# Patient Record
Sex: Female | Born: 1965 | Race: Black or African American | Hispanic: No | Marital: Single | Smoking: Current every day smoker
Health system: Southern US, Community
[De-identification: ages and names within clinical notes are randomized; demographics above are authoritative.]

## PROBLEM LIST (undated history)

## (undated) DIAGNOSIS — J302 Other seasonal allergic rhinitis: Secondary | ICD-10-CM

## (undated) DIAGNOSIS — J189 Pneumonia, unspecified organism: Secondary | ICD-10-CM

## (undated) DIAGNOSIS — Z973 Presence of spectacles and contact lenses: Secondary | ICD-10-CM

## (undated) DIAGNOSIS — F32A Depression, unspecified: Secondary | ICD-10-CM

## (undated) DIAGNOSIS — K219 Gastro-esophageal reflux disease without esophagitis: Secondary | ICD-10-CM

## (undated) DIAGNOSIS — F329 Major depressive disorder, single episode, unspecified: Secondary | ICD-10-CM

## (undated) DIAGNOSIS — S83209A Unspecified tear of unspecified meniscus, current injury, unspecified knee, initial encounter: Secondary | ICD-10-CM

## (undated) HISTORY — PX: NO PAST SURGERIES: SHX2092

## (undated) HISTORY — PX: ANKLE SURGERY: SHX546

---

## 1997-04-11 ENCOUNTER — Emergency Department (HOSPITAL_COMMUNITY): Admission: EM | Admit: 1997-04-11 | Discharge: 1997-04-11 | Payer: Self-pay | Admitting: Emergency Medicine

## 2003-03-18 ENCOUNTER — Inpatient Hospital Stay (HOSPITAL_COMMUNITY): Admission: EM | Admit: 2003-03-18 | Discharge: 2003-03-24 | Payer: Self-pay | Admitting: Emergency Medicine

## 2004-11-30 ENCOUNTER — Emergency Department (HOSPITAL_COMMUNITY): Admission: EM | Admit: 2004-11-30 | Discharge: 2004-11-30 | Payer: Self-pay | Admitting: Emergency Medicine

## 2005-01-22 ENCOUNTER — Emergency Department (HOSPITAL_COMMUNITY): Admission: EM | Admit: 2005-01-22 | Discharge: 2005-01-22 | Payer: Self-pay | Admitting: Emergency Medicine

## 2005-05-08 ENCOUNTER — Emergency Department (HOSPITAL_COMMUNITY): Admission: EM | Admit: 2005-05-08 | Discharge: 2005-05-08 | Payer: Self-pay | Admitting: Emergency Medicine

## 2005-05-08 ENCOUNTER — Emergency Department (HOSPITAL_COMMUNITY): Admission: EM | Admit: 2005-05-08 | Discharge: 2005-05-09 | Payer: Self-pay | Admitting: Emergency Medicine

## 2005-06-08 ENCOUNTER — Emergency Department (HOSPITAL_COMMUNITY): Admission: EM | Admit: 2005-06-08 | Discharge: 2005-06-08 | Payer: Self-pay | Admitting: Emergency Medicine

## 2006-05-07 ENCOUNTER — Emergency Department (HOSPITAL_COMMUNITY): Admission: EM | Admit: 2006-05-07 | Discharge: 2006-05-07 | Payer: Self-pay | Admitting: Emergency Medicine

## 2006-07-01 ENCOUNTER — Emergency Department (HOSPITAL_COMMUNITY): Admission: EM | Admit: 2006-07-01 | Discharge: 2006-07-01 | Payer: Self-pay | Admitting: Emergency Medicine

## 2006-07-14 ENCOUNTER — Emergency Department (HOSPITAL_COMMUNITY): Admission: EM | Admit: 2006-07-14 | Discharge: 2006-07-14 | Payer: Self-pay | Admitting: Emergency Medicine

## 2008-04-20 ENCOUNTER — Emergency Department (HOSPITAL_COMMUNITY): Admission: EM | Admit: 2008-04-20 | Discharge: 2008-04-20 | Payer: Self-pay | Admitting: Emergency Medicine

## 2008-06-02 ENCOUNTER — Emergency Department (HOSPITAL_COMMUNITY): Admission: EM | Admit: 2008-06-02 | Discharge: 2008-06-02 | Payer: Self-pay | Admitting: Emergency Medicine

## 2008-06-26 ENCOUNTER — Emergency Department (HOSPITAL_COMMUNITY): Admission: EM | Admit: 2008-06-26 | Discharge: 2008-06-26 | Payer: Self-pay | Admitting: Emergency Medicine

## 2008-07-14 ENCOUNTER — Encounter: Payer: Self-pay | Admitting: Obstetrics & Gynecology

## 2008-07-14 ENCOUNTER — Inpatient Hospital Stay (HOSPITAL_COMMUNITY): Admission: AD | Admit: 2008-07-14 | Discharge: 2008-07-14 | Payer: Self-pay | Admitting: Obstetrics & Gynecology

## 2008-07-17 ENCOUNTER — Inpatient Hospital Stay (HOSPITAL_COMMUNITY): Admission: AD | Admit: 2008-07-17 | Discharge: 2008-07-17 | Payer: Self-pay | Admitting: Obstetrics & Gynecology

## 2008-07-24 ENCOUNTER — Ambulatory Visit (HOSPITAL_COMMUNITY): Admission: AD | Admit: 2008-07-24 | Discharge: 2008-07-24 | Payer: Self-pay | Admitting: Obstetrics & Gynecology

## 2008-10-25 ENCOUNTER — Inpatient Hospital Stay (HOSPITAL_COMMUNITY): Admission: AD | Admit: 2008-10-25 | Discharge: 2008-10-25 | Payer: Self-pay | Admitting: Obstetrics & Gynecology

## 2009-02-26 ENCOUNTER — Emergency Department (HOSPITAL_COMMUNITY): Admission: EM | Admit: 2009-02-26 | Discharge: 2009-02-26 | Payer: Self-pay | Admitting: Emergency Medicine

## 2010-04-12 LAB — URINE MICROSCOPIC-ADD ON

## 2010-04-12 LAB — URINALYSIS, ROUTINE W REFLEX MICROSCOPIC
Bilirubin Urine: NEGATIVE
Glucose, UA: NEGATIVE mg/dL
Ketones, ur: NEGATIVE mg/dL
Leukocytes, UA: NEGATIVE
Nitrite: NEGATIVE
Protein, ur: NEGATIVE mg/dL
Specific Gravity, Urine: >1.03 — ABNORMAL HIGH (ref 1.005–1.030)
Urobilinogen, UA: 0.2 mg/dL (ref 0.0–1.0)
pH: 5.5 (ref 5.0–8.0)

## 2010-04-12 LAB — CBC
HCT: 41.2 % (ref 36.0–46.0)
Hemoglobin: 13.7 g/dL (ref 12.0–15.0)
MCHC: 33.2 g/dL (ref 30.0–36.0)
MCV: 98.7 fL (ref 78.0–100.0)
Platelets: 219 10*3/uL (ref 150–400)
RBC: 4.18 MIL/uL (ref 3.87–5.11)
RDW: 12.9 % (ref 11.5–15.5)
WBC: 8.3 10*3/uL (ref 4.0–10.5)

## 2010-04-12 LAB — WET PREP, GENITAL
Trich, Wet Prep: NONE SEEN
Yeast Wet Prep HPF POC: NONE SEEN

## 2010-04-12 LAB — GC/CHLAMYDIA PROBE AMP, GENITAL
Chlamydia, DNA Probe: NEGATIVE
GC Probe Amp, Genital: NEGATIVE

## 2010-04-12 LAB — POCT PREGNANCY, URINE: Preg Test, Ur: NEGATIVE

## 2010-04-15 LAB — URINALYSIS, ROUTINE W REFLEX MICROSCOPIC
Bilirubin Urine: NEGATIVE
Glucose, UA: NEGATIVE mg/dL
Ketones, ur: NEGATIVE mg/dL
Leukocytes, UA: NEGATIVE
Nitrite: NEGATIVE
Protein, ur: NEGATIVE mg/dL
Specific Gravity, Urine: <1.005 — ABNORMAL LOW (ref 1.005–1.030)
Urobilinogen, UA: 0.2 mg/dL (ref 0.0–1.0)
pH: 5 (ref 5.0–8.0)

## 2010-04-15 LAB — RAPID URINE DRUG SCREEN, HOSP PERFORMED
Amphetamines: NOT DETECTED
Barbiturates: NOT DETECTED
Benzodiazepines: NOT DETECTED
Cocaine: NOT DETECTED
Opiates: NOT DETECTED
Tetrahydrocannabinol: NOT DETECTED

## 2010-04-15 LAB — GC/CHLAMYDIA PROBE AMP, GENITAL
Chlamydia, DNA Probe: NEGATIVE
GC Probe Amp, Genital: NEGATIVE

## 2010-04-15 LAB — CBC
HCT: 40.4 % (ref 36.0–46.0)
Hemoglobin: 13.7 g/dL (ref 12.0–15.0)
MCHC: 33.9 g/dL (ref 30.0–36.0)
MCV: 98.3 fL (ref 78.0–100.0)
Platelets: 250 10*3/uL (ref 150–400)
RBC: 4.11 MIL/uL (ref 3.87–5.11)
RDW: 13.1 % (ref 11.5–15.5)
WBC: 10.9 10*3/uL — ABNORMAL HIGH (ref 4.0–10.5)

## 2010-04-15 LAB — BASIC METABOLIC PANEL
BUN: 4 mg/dL — ABNORMAL LOW (ref 6–23)
CO2: 23 mEq/L (ref 19–32)
Calcium: 9.3 mg/dL (ref 8.4–10.5)
Chloride: 106 mEq/L (ref 96–112)
Creatinine, Ser: 0.68 mg/dL (ref 0.4–1.2)
GFR calc Af Amer: >60 mL/min (ref >60)
GFR calc non Af Amer: >60 mL/min (ref >60)
Glucose, Bld: 115 mg/dL — ABNORMAL HIGH (ref 70–99)
Potassium: 3.5 mEq/L (ref 3.5–5.1)
Sodium: 137 mEq/L (ref 135–145)

## 2010-04-15 LAB — URINE MICROSCOPIC-ADD ON: WBC, UA: NONE SEEN WBC/hpf (ref <3)

## 2010-04-15 LAB — DIFFERENTIAL
Basophils Absolute: 0 10*3/uL (ref 0.0–0.1)
Basophils Relative: 0 % (ref 0–1)
Eosinophils Absolute: 0.1 10*3/uL (ref 0.0–0.7)
Eosinophils Relative: 1 % (ref 0–5)
Lymphocytes Relative: 33 % (ref 12–46)
Lymphs Abs: 3.6 10*3/uL (ref 0.7–4.0)
Monocytes Absolute: 0.9 10*3/uL (ref 0.1–1.0)
Monocytes Relative: 8 % (ref 3–12)
Neutro Abs: 6.3 10*3/uL (ref 1.7–7.7)
Neutrophils Relative %: 58 % (ref 43–77)

## 2010-04-15 LAB — WET PREP, GENITAL
Clue Cells Wet Prep HPF POC: NONE SEEN
Trich, Wet Prep: NONE SEEN
Yeast Wet Prep HPF POC: NONE SEEN

## 2010-04-15 LAB — HCG, QUANTITATIVE, PREGNANCY
hCG, Beta Chain, Quant, S: 289 m[IU]/mL — ABNORMAL HIGH (ref <5)
hCG, Beta Chain, Quant, S: 67 m[IU]/mL — ABNORMAL HIGH (ref <5)
hCG, Beta Chain, Quant, S: 8 m[IU]/mL — ABNORMAL HIGH (ref <5)

## 2010-04-15 LAB — ABO/RH: ABO/RH(D): A POS

## 2010-04-15 LAB — POCT PREGNANCY, URINE: Preg Test, Ur: POSITIVE

## 2010-05-25 NOTE — Discharge Summary (Signed)
NAME:  Angel Mendez, Angel Mendez                     ACCOUNT NO.:  [id number]   MEDICAL RECORD NO.:  [id number]                   PATIENT TYPE:  INP   LOCATION:  5028                                 FACILITY:  [facility]   PHYSICIAN:  Lorelle Formosa, M.D.           DATE OF BIRTH:  08-06-1965   DATE OF ADMISSION:  03/18/2003  DATE OF DISCHARGE:  03/24/2003                                 DISCHARGE SUMMARY   ADMISSION DIAGNOSIS:  Pneumonia __________  .   DISCHARGE CONDITION:  Stable.   DISCHARGE MEDICATIONS:  1. Avelox 400 mg daily.  2. Tylenol p.r.n. for pain.   HISTORY:  The patient is a 45 year old white woman presented to the  emergency room with chest pain onset __________  family.  She became ill  approximately two days.  Had some diarrhea but no chills, no fever.   PHYSICAL EXAMINATION:  VITAL SIGNS:  Blood pressure 99/72, pulse 78,  respirations 19, temperature 97.8, O2 sat on room air was 95.  HEENT:  Within normal limits.  NECK:  Supple.  CHEST:  Clear to auscultation.  HEART:  Regular rhythm rate.  No murmur.  ABDOMEN:  Soft.  EXTREMITIES:  __________ .  NEUROLOGIC:  Grossly unremarkable.   LABS:  PH of 7.42, pCO2 of 47.  WBC 13.3 with a hemoglobin of 12.2,  hematocrit 35.8, platelets 284,000.  Repeat was white count of 8.3.  Chemistries revealed initial potassium 3.3, corrected to 4.  Cultures of  blood and urine.  Culture of blood was negative with urine culture  Streptococcus BP of greater than 100,000 colony count with high probability  for Streptococcus bovis.   Chest x-ray revealed a slight increase in the left lower lobe consolidation  and a stable to slightly larger pleural effusion with minimal streaking  right basilar atelectasis.  Repeat chest x-ray, March 23, 2003, two days  later, revealed improved left lower lobe infiltrate with probable small left  pleural effusion.   HOSPITAL COURSE:  The patient was admitted to the hospital.  Given Avelox  400 mg  daily and albuterol 2.5 mg q. 6h. to  q.4h.  She was monitored and  gradually improved. Thus, she was discharged to outpatient followup.                                                Lorelle Formosa, M.D.    WWM/MEDQ  D:  05/26/2003  T:  05/28/2003  Job:  [id number]

## 2010-08-24 ENCOUNTER — Inpatient Hospital Stay (HOSPITAL_COMMUNITY)
Admission: EM | Admit: 2010-08-24 | Discharge: 2010-08-25 | DRG: 639 | Disposition: A | Discharge status: Home / Self Care | Payer: Self-pay | Attending: Emergency Medicine | Admitting: Emergency Medicine

## 2010-08-24 DIAGNOSIS — I10 Essential (primary) hypertension: Secondary | ICD-10-CM | POA: Diagnosis present

## 2010-08-24 DIAGNOSIS — E119 Type 2 diabetes mellitus without complications: Principal | ICD-10-CM | POA: Diagnosis present

## 2010-08-24 DIAGNOSIS — F17200 Nicotine dependence, unspecified, uncomplicated: Secondary | ICD-10-CM | POA: Diagnosis present

## 2010-08-24 DIAGNOSIS — A5901 Trichomonal vulvovaginitis: Secondary | ICD-10-CM | POA: Diagnosis present

## 2010-08-24 DIAGNOSIS — F1020 Alcohol dependence, uncomplicated: Secondary | ICD-10-CM | POA: Diagnosis present

## 2010-08-24 DIAGNOSIS — J45909 Unspecified asthma, uncomplicated: Secondary | ICD-10-CM | POA: Diagnosis present

## 2010-08-24 LAB — GLUCOSE, CAPILLARY
Glucose-Capillary: 321 mg/dL — ABNORMAL HIGH (ref 70–99)
Glucose-Capillary: 340 mg/dL — ABNORMAL HIGH (ref 70–99)
Glucose-Capillary: 523 mg/dL — ABNORMAL HIGH (ref 70–99)

## 2010-08-24 LAB — URINALYSIS, ROUTINE W REFLEX MICROSCOPIC
Bilirubin Urine: NEGATIVE
Glucose, UA: >1000 mg/dL — AB
Ketones, ur: NEGATIVE mg/dL
Nitrite: NEGATIVE
Protein, ur: NEGATIVE mg/dL
Specific Gravity, Urine: 1.024 (ref 1.005–1.030)
Urobilinogen, UA: 0.2 mg/dL (ref 0.0–1.0)
pH: 5.5 (ref 5.0–8.0)

## 2010-08-24 LAB — COMPREHENSIVE METABOLIC PANEL
ALT: 44 U/L — ABNORMAL HIGH (ref 0–35)
AST: 40 U/L — ABNORMAL HIGH (ref 0–37)
Albumin: 3.4 g/dL — ABNORMAL LOW (ref 3.5–5.2)
Alkaline Phosphatase: 103 U/L (ref 39–117)
BUN: 4 mg/dL — ABNORMAL LOW (ref 6–23)
CO2: 24 mEq/L (ref 19–32)
Calcium: 9.7 mg/dL (ref 8.4–10.5)
Chloride: 95 mEq/L — ABNORMAL LOW (ref 96–112)
Creatinine, Ser: 0.77 mg/dL (ref 0.50–1.10)
GFR calc Af Amer: >60 mL/min (ref >60)
GFR calc non Af Amer: >60 mL/min (ref >60)
Glucose, Bld: 512 mg/dL — ABNORMAL HIGH (ref 70–99)
Potassium: 3.9 mEq/L (ref 3.5–5.1)
Sodium: 131 mEq/L — ABNORMAL LOW (ref 135–145)
Total Bilirubin: 0.3 mg/dL (ref 0.3–1.2)
Total Protein: 7.4 g/dL (ref 6.0–8.3)

## 2010-08-24 LAB — CBC
HCT: 41.9 % (ref 36.0–46.0)
Hemoglobin: 14.9 g/dL (ref 12.0–15.0)
MCH: 32 pg (ref 26.0–34.0)
MCHC: 35.6 g/dL (ref 30.0–36.0)
MCV: 89.9 fL (ref 78.0–100.0)
Platelets: 192 10*3/uL (ref 150–400)
RBC: 4.66 MIL/uL (ref 3.87–5.11)
RDW: 12.2 % (ref 11.5–15.5)
WBC: 11.3 10*3/uL — ABNORMAL HIGH (ref 4.0–10.5)

## 2010-08-24 LAB — URINE MICROSCOPIC-ADD ON

## 2010-08-24 LAB — POCT I-STAT, CHEM 8
BUN: <3 mg/dL — ABNORMAL LOW (ref 6–23)
Calcium, Ion: 1.18 mmol/L (ref 1.12–1.32)
Chloride: 97 mEq/L (ref 96–112)
Creatinine, Ser: 0.9 mg/dL (ref 0.50–1.10)
Glucose, Bld: 479 mg/dL — ABNORMAL HIGH (ref 70–99)
HCT: 48 % — ABNORMAL HIGH (ref 36.0–46.0)
Hemoglobin: 16.3 g/dL — ABNORMAL HIGH (ref 12.0–15.0)
Potassium: 4.3 mEq/L (ref 3.5–5.1)
Sodium: 132 mEq/L — ABNORMAL LOW (ref 135–145)
TCO2: 24 mmol/L (ref 0–100)

## 2010-08-24 LAB — DIFFERENTIAL
Basophils Absolute: 0 10*3/uL (ref 0.0–0.1)
Basophils Relative: 0 % (ref 0–1)
Eosinophils Absolute: 0.2 10*3/uL (ref 0.0–0.7)
Eosinophils Relative: 2 % (ref 0–5)
Lymphocytes Relative: 41 % (ref 12–46)
Lymphs Abs: 4.6 10*3/uL — ABNORMAL HIGH (ref 0.7–4.0)
Monocytes Absolute: 0.5 10*3/uL (ref 0.1–1.0)
Monocytes Relative: 5 % (ref 3–12)
Neutro Abs: 5.9 10*3/uL (ref 1.7–7.7)
Neutrophils Relative %: 52 % (ref 43–77)

## 2010-08-24 LAB — LIPASE, BLOOD: Lipase: 44 U/L (ref 11–59)

## 2010-08-25 LAB — BASIC METABOLIC PANEL
BUN: 4 mg/dL — ABNORMAL LOW (ref 6–23)
CO2: 22 mEq/L (ref 19–32)
Calcium: 8.8 mg/dL (ref 8.4–10.5)
Chloride: 99 mEq/L (ref 96–112)
Creatinine, Ser: 0.66 mg/dL (ref 0.50–1.10)
GFR calc Af Amer: >60 mL/min (ref >60)
GFR calc non Af Amer: >60 mL/min (ref >60)
Glucose, Bld: 400 mg/dL — ABNORMAL HIGH (ref 70–99)
Potassium: 3.5 mEq/L (ref 3.5–5.1)
Sodium: 131 mEq/L — ABNORMAL LOW (ref 135–145)

## 2010-08-25 LAB — GLUCOSE, CAPILLARY
Glucose-Capillary: 157 mg/dL — ABNORMAL HIGH (ref 70–99)
Glucose-Capillary: 174 mg/dL — ABNORMAL HIGH (ref 70–99)
Glucose-Capillary: 278 mg/dL — ABNORMAL HIGH (ref 70–99)

## 2010-10-23 LAB — POCT PREGNANCY, URINE
Operator id: 10119
Preg Test, Ur: NEGATIVE

## 2010-12-25 ENCOUNTER — Emergency Department (HOSPITAL_COMMUNITY): Payer: Medicaid Other

## 2010-12-25 ENCOUNTER — Emergency Department (HOSPITAL_COMMUNITY)
Admission: EM | Admit: 2010-12-25 | Discharge: 2010-12-25 | Disposition: A | Discharge status: Home / Self Care | Payer: Medicaid Other | Attending: Emergency Medicine | Admitting: Emergency Medicine

## 2010-12-25 ENCOUNTER — Encounter: Payer: Self-pay | Admitting: *Deleted

## 2010-12-25 DIAGNOSIS — S63509A Unspecified sprain of unspecified wrist, initial encounter: Secondary | ICD-10-CM | POA: Insufficient documentation

## 2010-12-25 DIAGNOSIS — W010XXA Fall on same level from slipping, tripping and stumbling without subsequent striking against object, initial encounter: Secondary | ICD-10-CM | POA: Insufficient documentation

## 2010-12-25 DIAGNOSIS — F17200 Nicotine dependence, unspecified, uncomplicated: Secondary | ICD-10-CM | POA: Insufficient documentation

## 2010-12-25 DIAGNOSIS — Z79899 Other long term (current) drug therapy: Secondary | ICD-10-CM | POA: Insufficient documentation

## 2010-12-25 DIAGNOSIS — E119 Type 2 diabetes mellitus without complications: Secondary | ICD-10-CM | POA: Insufficient documentation

## 2010-12-25 DIAGNOSIS — F329 Major depressive disorder, single episode, unspecified: Secondary | ICD-10-CM | POA: Insufficient documentation

## 2010-12-25 DIAGNOSIS — F3289 Other specified depressive episodes: Secondary | ICD-10-CM | POA: Insufficient documentation

## 2010-12-25 DIAGNOSIS — M25539 Pain in unspecified wrist: Secondary | ICD-10-CM | POA: Insufficient documentation

## 2010-12-25 DIAGNOSIS — M25439 Effusion, unspecified wrist: Secondary | ICD-10-CM | POA: Insufficient documentation

## 2010-12-25 DIAGNOSIS — M79609 Pain in unspecified limb: Secondary | ICD-10-CM | POA: Insufficient documentation

## 2010-12-25 HISTORY — DX: Depression, unspecified: F32.A

## 2010-12-25 HISTORY — DX: Major depressive disorder, single episode, unspecified: F32.9

## 2010-12-25 MED ORDER — HYDROCODONE-ACETAMINOPHEN 5-325 MG PO TABS: 1.0000 | ORAL_TABLET | ORAL | Status: AC | PRN

## 2010-12-25 MED ORDER — IBUPROFEN 800 MG PO TABS: 800.0000 mg | ORAL_TABLET | Freq: Three times a day (TID) | ORAL | Status: AC

## 2010-12-25 NOTE — Progress Notes (Signed)
Orthopedic Tech Progress Note Patient Details:  Angel Mendez 04/28/1965 [id number]  Type of Splint: Other (comment) (6" velcro wrist splint) Splint Location: (R) UE Splint Interventions: Application    Jennye Moccasin 12/25/2010, 5:43 PM

## 2010-12-25 NOTE — ED Provider Notes (Signed)
History     CSN: [id number] Arrival date & time: 12/25/2010  3:31 PM   First MD Initiated Contact with Patient 12/25/10 1724      Chief Complaint  Patient presents with  . Fall  . Wrist Pain    (Consider location/radiation/quality/duration/timing/severity/associated sxs/prior treatment) Patient is a 45 y.o. female presenting with fall and wrist pain. The history is provided by the patient.  Fall The accident occurred yesterday. Incident: She fell forward while at home, landing on soft surface on flexed right wrist causing pain. Pertinent negatives include no fever. The symptoms are aggravated by activity.  Wrist Pain Pertinent negatives include no chills or fever.    Past Medical History  Diagnosis Date  . Diabetes mellitus   . Depression     History reviewed. No pertinent past surgical history.  No family history on file.  History  Substance Use Topics  . Smoking status: Current Everyday Smoker  . Smokeless tobacco: Not on file  . Alcohol Use: No    OB History    Grav Para Term Preterm Abortions TAB SAB Ect Mult Living                  Review of Systems  Constitutional: Negative for fever and chills.  HENT: Negative.   Respiratory: Negative.   Cardiovascular: Negative.   Gastrointestinal: Negative.   Musculoskeletal:       See HPI.  Skin: Negative.   Neurological: Negative.     Allergies  Review of patient's allergies indicates no known allergies.  Home Medications   Current Outpatient Rx  Name Route Sig Dispense Refill  . IBUPROFEN 200 MG PO TABS Oral Take 400 mg by mouth every 6 (six) hours as needed. For pain/headache     . METFORMIN HCL 850 MG PO TABS Oral Take 850 mg by mouth every evening.      . TRAZODONE HCL 100 MG PO TABS Oral Take 200 mg by mouth at bedtime.      Marland Kitchen ZOLPIDEM TARTRATE 10 MG PO TABS Oral Take 10 mg by mouth at bedtime as needed. For sleep       BP 111/66  Pulse 78  Temp(Src) 98.5 F (36.9 C) (Oral)  Resp 16  SpO2 95%   LMP 12/05/2010  Physical Exam  Constitutional: She is oriented to person, place, and time. She appears well-developed and well-nourished.  Neck: Normal range of motion.  Pulmonary/Chest: Effort normal.  Musculoskeletal:       Right wrist minimally swollen along ulnar aspect. No bony deformities. FROM with pain at extremes of range. Distal neurosensory intact.   Neurological: She is alert and oriented to person, place, and time.  Skin: Skin is warm and dry.    ED Course  Procedures (including critical care time)  Labs Reviewed - No data to display Dg Forearm Right  12/25/2010  *RADIOLOGY REPORT*  Clinical Data: Fall, wrist pain.  RIGHT FOREARM - 2 VIEW  Comparison: None.  Findings: No acute bony or joint abnormality is identified. Degenerative disease of the first [facility] joint is noted.  No elbow joint effusion is noted.  Soft tissues are unremarkable.  IMPRESSION: No acute finding.  Original Report Authenticated By: Bernadene Bell. D'ALESSIO, M.D.   Dg Wrist Complete Right  12/25/2010  *RADIOLOGY REPORT*  Clinical Data: Fall, wrist and forearm pain  RIGHT WRIST - COMPLETE 3+ VIEW  Comparison: 11/30/2004  Findings: Normal alignment.  No acute fracture.  Slight progression of arthritic changes of the right first  [facility] joint involving the trapezium and the first metacarpal base.  IMPRESSION: No acute osseous finding. 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  Original Report Authenticated By: Judie Petit. Ruel Favors, M.D.     No diagnosis found.    MDM  X-ray of wrist and forearm negative for any fractures.        Rodena Medin, PA 12/25/10 [phone]

## 2010-12-25 NOTE — ED Notes (Signed)
Patient reports she fell last night and has injured her right wrist.  She has increased pain with movement.  Patient has swelling noted in the wrist and forearm

## 2010-12-25 NOTE — ED Notes (Signed)
Rt wrist pain and swelling from fall last pm. Denies loc

## 2011-01-02 NOTE — ED Provider Notes (Signed)
Medical screening examination/treatment/procedure(s) were performed by non-physician practitioner and as supervising physician I was immediately available for consultation/collaboration.   Suzi Roots, MD 01/02/11 [phone]

## 2011-02-17 ENCOUNTER — Encounter (HOSPITAL_COMMUNITY): Payer: Self-pay | Admitting: Emergency Medicine

## 2011-02-17 ENCOUNTER — Emergency Department (HOSPITAL_COMMUNITY): Payer: Medicaid Other

## 2011-02-17 ENCOUNTER — Emergency Department (HOSPITAL_COMMUNITY)
Admission: EM | Admit: 2011-02-17 | Discharge: 2011-02-17 | Disposition: A | Discharge status: Home / Self Care | Payer: Medicaid Other | Attending: Emergency Medicine | Admitting: Emergency Medicine

## 2011-02-17 DIAGNOSIS — F3289 Other specified depressive episodes: Secondary | ICD-10-CM | POA: Insufficient documentation

## 2011-02-17 DIAGNOSIS — Z79899 Other long term (current) drug therapy: Secondary | ICD-10-CM | POA: Insufficient documentation

## 2011-02-17 DIAGNOSIS — M25569 Pain in unspecified knee: Secondary | ICD-10-CM | POA: Insufficient documentation

## 2011-02-17 DIAGNOSIS — E119 Type 2 diabetes mellitus without complications: Secondary | ICD-10-CM | POA: Insufficient documentation

## 2011-02-17 DIAGNOSIS — M25461 Effusion, right knee: Secondary | ICD-10-CM

## 2011-02-17 DIAGNOSIS — F17200 Nicotine dependence, unspecified, uncomplicated: Secondary | ICD-10-CM | POA: Insufficient documentation

## 2011-02-17 DIAGNOSIS — M25469 Effusion, unspecified knee: Secondary | ICD-10-CM | POA: Insufficient documentation

## 2011-02-17 DIAGNOSIS — F329 Major depressive disorder, single episode, unspecified: Secondary | ICD-10-CM | POA: Insufficient documentation

## 2011-02-17 LAB — GLUCOSE, CAPILLARY: Glucose-Capillary: 127 mg/dL — ABNORMAL HIGH (ref 70–99)

## 2011-02-17 MED ORDER — IBUPROFEN 100 MG/5ML PO SUSP
600.0000 mg | Freq: Once | ORAL | Status: AC
  Administered 2011-02-17: 600 mg via ORAL

## 2011-02-17 MED ORDER — IBUPROFEN 600 MG PO TABS: 600.0000 mg | ORAL_TABLET | Freq: Four times a day (QID) | ORAL | Status: AC | PRN

## 2011-02-17 MED ORDER — IBUPROFEN 100 MG/5ML PO SUSP
ORAL | Status: AC
  Filled 2011-02-17: qty 25

## 2011-02-17 NOTE — ED Notes (Signed)
Crutches and knee immobilizer applied by ortho tech.

## 2011-02-17 NOTE — Progress Notes (Signed)
Orthopedic Tech Progress Note Patient Details:  Angel Mendez 11-13-1965 [id number]  Other Ortho Devices Type of Ortho Device: Crutches Ortho Device Location: (R) LE Ortho Device Interventions: Casandra Doffing 02/17/2011, 9:38 PM

## 2011-02-17 NOTE — ED Notes (Signed)
The pt has had rt knee pain and swelling for 3 weeks.  No known injury.  She thinks it is gout

## 2011-02-17 NOTE — ED Provider Notes (Signed)
History     CSN: [id number]  Arrival date & time 02/17/11  1191   First MD Initiated Contact with Patient 02/17/11 2010      Chief Complaint  Patient presents with  . Knee Pain    (Consider location/radiation/quality/duration/timing/severity/associated sxs/prior treatment) HPI Comments: 3 weeks ago after being at the gym developed R knee pain has been taking Vicodan without relief   Patient is a 46 y.o. female presenting with knee pain. The history is provided by the patient.  Knee Pain This is a new problem. The current episode started 1 to 4 weeks ago. The problem occurs constantly. The problem has been gradually worsening. Associated symptoms include joint swelling. Pertinent negatives include no fever.    Past Medical History  Diagnosis Date  . Diabetes mellitus   . Depression     History reviewed. No pertinent past surgical history.  No family history on file.  History  Substance Use Topics  . Smoking status: Current Everyday Smoker  . Smokeless tobacco: Not on file  . Alcohol Use: No    OB History    Grav Para Term Preterm Abortions TAB SAB Ect Mult Living                  Review of Systems  Constitutional: Negative for fever.  Cardiovascular: Negative for leg swelling.  Musculoskeletal: Positive for joint swelling.    Allergies  Review of patient's allergies indicates no known allergies.  Home Medications   Current Outpatient Rx  Name Route Sig Dispense Refill  . HYDROCODONE-ACETAMINOPHEN 10-500 MG PO TABS Oral Take 1 tablet by mouth every 4 (four) hours as needed. For pain    . METFORMIN HCL 850 MG PO TABS Oral Take 850 mg by mouth every evening.        BP 125/77  Pulse 83  Temp(Src) 98.1 F (36.7 C) (Oral)  Resp 18  SpO2 95%  Physical Exam  Constitutional: She is oriented to person, place, and time. She appears well-developed and well-nourished.  HENT:  Head: Normocephalic.  Eyes: Pupils are equal, round, and reactive to light.  Neck:  Normal range of motion.  Cardiovascular: Normal rate.   Pulmonary/Chest: Effort normal.  Musculoskeletal: She exhibits tenderness. She exhibits no edema.       Legs: Neurological: She is alert and oriented to person, place, and time.  Skin: Skin is warm and dry.    ED Course  Procedures (including critical care time)  Labs Reviewed - No data to display No results found.   No diagnosis found.    MDM  Will xray knee         Arman Filter, NP 02/17/11 2024

## 2011-02-17 NOTE — Progress Notes (Signed)
Orthopedic Tech Progress Note Patient Details:  Angel Mendez [birth year] [id number]  Other Ortho Devices Type of Ortho Device: Knee Immobilizer Ortho Device Location: (R) LE Ortho Device Interventions: Application   Jennye Moccasin 02/17/2011, 9:29 PM

## 2011-02-17 NOTE — ED Notes (Signed)
R knee pain x 3 weeks.  No known injury.

## 2011-02-18 NOTE — ED Provider Notes (Signed)
Medical screening examination/treatment/procedure(s) were performed by non-physician practitioner and as supervising physician I was immediately available for consultation/collaboration.   Laray Anger, DO 02/18/11 [phone]

## 2011-04-08 DIAGNOSIS — S83209A Unspecified tear of unspecified meniscus, current injury, unspecified knee, initial encounter: Secondary | ICD-10-CM

## 2011-04-08 HISTORY — DX: Unspecified tear of unspecified meniscus, current injury, unspecified knee, initial encounter: S83.209A

## 2011-04-11 ENCOUNTER — Other Ambulatory Visit: Payer: Self-pay | Admitting: Physician Assistant

## 2011-04-12 ENCOUNTER — Encounter (HOSPITAL_BASED_OUTPATIENT_CLINIC_OR_DEPARTMENT_OTHER): Payer: Self-pay | Admitting: *Deleted

## 2011-04-12 NOTE — Pre-Procedure Instructions (Signed)
To come for BMET and EKG 

## 2011-04-15 ENCOUNTER — Other Ambulatory Visit: Payer: Self-pay

## 2011-04-15 ENCOUNTER — Encounter (HOSPITAL_BASED_OUTPATIENT_CLINIC_OR_DEPARTMENT_OTHER)
Admission: RE | Admit: 2011-04-15 | Discharge: 2011-04-15 | Disposition: A | Discharge status: Home / Self Care | Payer: Medicaid Other | Source: Ambulatory Visit | Attending: Orthopedic Surgery | Admitting: Orthopedic Surgery

## 2011-04-15 LAB — BASIC METABOLIC PANEL
BUN: 11 mg/dL (ref 6–23)
CO2: 21 mEq/L (ref 19–32)
Calcium: 8.8 mg/dL (ref 8.4–10.5)
Chloride: 106 mEq/L (ref 96–112)
Creatinine, Ser: 0.72 mg/dL (ref 0.50–1.10)
GFR calc Af Amer: >90 mL/min (ref >90)
GFR calc non Af Amer: >90 mL/min (ref >90)
Glucose, Bld: 187 mg/dL — ABNORMAL HIGH (ref 70–99)
Potassium: 4.3 mEq/L (ref 3.5–5.1)
Sodium: 135 mEq/L (ref 135–145)

## 2011-04-17 ENCOUNTER — Encounter (HOSPITAL_BASED_OUTPATIENT_CLINIC_OR_DEPARTMENT_OTHER): Payer: Self-pay | Admitting: *Deleted

## 2011-04-17 ENCOUNTER — Encounter (HOSPITAL_BASED_OUTPATIENT_CLINIC_OR_DEPARTMENT_OTHER)
Admission: RE | Disposition: A | Discharge status: Home / Self Care | Payer: Self-pay | Source: Ambulatory Visit | Attending: Orthopedic Surgery

## 2011-04-17 ENCOUNTER — Ambulatory Visit (HOSPITAL_BASED_OUTPATIENT_CLINIC_OR_DEPARTMENT_OTHER)
Admission: RE | Admit: 2011-04-17 | Discharge: 2011-04-17 | Disposition: A | Discharge status: Home / Self Care | Payer: Medicaid Other | Source: Ambulatory Visit | Attending: Orthopedic Surgery | Admitting: Orthopedic Surgery

## 2011-04-17 ENCOUNTER — Ambulatory Visit (HOSPITAL_BASED_OUTPATIENT_CLINIC_OR_DEPARTMENT_OTHER): Payer: Medicaid Other | Admitting: Anesthesiology

## 2011-04-17 ENCOUNTER — Encounter (HOSPITAL_BASED_OUTPATIENT_CLINIC_OR_DEPARTMENT_OTHER): Payer: Self-pay | Admitting: Anesthesiology

## 2011-04-17 DIAGNOSIS — Z01812 Encounter for preprocedural laboratory examination: Secondary | ICD-10-CM | POA: Insufficient documentation

## 2011-04-17 DIAGNOSIS — K219 Gastro-esophageal reflux disease without esophagitis: Secondary | ICD-10-CM | POA: Insufficient documentation

## 2011-04-17 DIAGNOSIS — M23305 Other meniscus derangements, unspecified medial meniscus, unspecified knee: Secondary | ICD-10-CM | POA: Insufficient documentation

## 2011-04-17 DIAGNOSIS — F3289 Other specified depressive episodes: Secondary | ICD-10-CM | POA: Insufficient documentation

## 2011-04-17 DIAGNOSIS — M25569 Pain in unspecified knee: Secondary | ICD-10-CM

## 2011-04-17 DIAGNOSIS — E119 Type 2 diabetes mellitus without complications: Secondary | ICD-10-CM | POA: Insufficient documentation

## 2011-04-17 DIAGNOSIS — F329 Major depressive disorder, single episode, unspecified: Secondary | ICD-10-CM | POA: Insufficient documentation

## 2011-04-17 DIAGNOSIS — M6750 Plica syndrome, unspecified knee: Secondary | ICD-10-CM | POA: Insufficient documentation

## 2011-04-17 HISTORY — DX: Gastro-esophageal reflux disease without esophagitis: K21.9

## 2011-04-17 HISTORY — PX: KNEE ARTHROSCOPY: SHX127

## 2011-04-17 HISTORY — DX: Other seasonal allergic rhinitis: J30.2

## 2011-04-17 HISTORY — DX: Unspecified tear of unspecified meniscus, current injury, unspecified knee, initial encounter: S83.209A

## 2011-04-17 LAB — GLUCOSE, CAPILLARY
Glucose-Capillary: 141 mg/dL — ABNORMAL HIGH (ref 70–99)
Glucose-Capillary: 191 mg/dL — ABNORMAL HIGH (ref 70–99)

## 2011-04-17 SURGERY — ARTHROSCOPY, KNEE
Anesthesia: General | Site: Knee | Laterality: Right | Wound class: Clean

## 2011-04-17 MED ORDER — METHOCARBAMOL 500 MG PO TABS: 500.0000 mg | ORAL_TABLET | Freq: Three times a day (TID) | ORAL | Status: AC

## 2011-04-17 MED ORDER — CHLORHEXIDINE GLUCONATE 4 % EX LIQD: 60.0000 mL | Freq: Once | CUTANEOUS | Status: DC

## 2011-04-17 MED ORDER — LACTATED RINGERS IV SOLN
INTRAVENOUS | Status: DC
  Administered 2011-04-17 (×2): via INTRAVENOUS

## 2011-04-17 MED ORDER — SODIUM CHLORIDE 0.9 % IV SOLN: INTRAVENOUS | Status: DC

## 2011-04-17 MED ORDER — HYDROCODONE-ACETAMINOPHEN 5-325 MG PO TABS: 1.0000 | ORAL_TABLET | Freq: Four times a day (QID) | ORAL | Status: AC | PRN

## 2011-04-17 MED ORDER — FENTANYL CITRATE 0.05 MG/ML IJ SOLN
INTRAMUSCULAR | Status: DC | PRN
  Administered 2011-04-17 (×3): 50 ug via INTRAVENOUS

## 2011-04-17 MED ORDER — BUPIVACAINE HCL (PF) 0.5 % IJ SOLN
INTRAMUSCULAR | Status: DC | PRN
  Administered 2011-04-17: 20 mL

## 2011-04-17 MED ORDER — HYDROMORPHONE HCL PF 1 MG/ML IJ SOLN
0.2500 mg | INTRAMUSCULAR | Status: DC | PRN
  Administered 2011-04-17: 0.5 mg via INTRAVENOUS
  Administered 2011-04-17 (×2): 0.25 mg via INTRAVENOUS

## 2011-04-17 MED ORDER — MIDAZOLAM HCL 2 MG/2ML IJ SOLN: 1.0000 mg | INTRAMUSCULAR | Status: DC | PRN

## 2011-04-17 MED ORDER — SODIUM CHLORIDE 0.9 % IR SOLN
Status: DC | PRN
  Administered 2011-04-17: 9000 mL

## 2011-04-17 MED ORDER — VITAMIN C 500 MG PO TABS: 500.0000 mg | ORAL_TABLET | Freq: Every day | ORAL | Status: DC

## 2011-04-17 MED ORDER — PROPOFOL 10 MG/ML IV EMUL
INTRAVENOUS | Status: DC | PRN
  Administered 2011-04-17: 200 mg via INTRAVENOUS

## 2011-04-17 MED ORDER — ESMOLOL HCL 10 MG/ML IV SOLN
INTRAVENOUS | Status: DC | PRN
  Administered 2011-04-17 (×2): 10 mg via INTRAVENOUS

## 2011-04-17 MED ORDER — CEFAZOLIN SODIUM 1-5 GM-% IV SOLN
1.0000 g | INTRAVENOUS | Status: AC
  Administered 2011-04-17: 2 g via INTRAVENOUS

## 2011-04-17 MED ORDER — LORAZEPAM 2 MG/ML IJ SOLN: 1.0000 mg | Freq: Once | INTRAMUSCULAR | Status: DC | PRN

## 2011-04-17 MED ORDER — MIDAZOLAM HCL 5 MG/5ML IJ SOLN
INTRAMUSCULAR | Status: DC | PRN
  Administered 2011-04-17: 2 mg via INTRAVENOUS

## 2011-04-17 MED ORDER — FENTANYL CITRATE 0.05 MG/ML IJ SOLN: 50.0000 ug | INTRAMUSCULAR | Status: DC | PRN

## 2011-04-17 MED ORDER — HYDROCODONE-ACETAMINOPHEN 5-325 MG PO TABS
1.0000 | ORAL_TABLET | Freq: Once | ORAL | Status: AC
  Administered 2011-04-17: 1 via ORAL

## 2011-04-17 SURGICAL SUPPLY — 41 items
BANDAGE ELASTIC 6 VELCRO ST LF (GAUZE/BANDAGES/DRESSINGS) ×2 IMPLANT
BLADE 4.2CUDA (BLADE) IMPLANT
BLADE CUDA GRT WHITE 3.5 (BLADE) IMPLANT
BLADE CUDA SHAVER 3.5 (BLADE) IMPLANT
BLADE CUTTER GATOR 3.5 (BLADE) ×1 IMPLANT
BLADE GREAT WHITE 4.2 (BLADE) IMPLANT
BUR CUDA 2.9 (BURR) ×1 IMPLANT
CANISTER OMNI JUG 16 LITER (MISCELLANEOUS) ×1 IMPLANT
CANISTER SUCTION 2500CC (MISCELLANEOUS) IMPLANT
CLOTH BEACON ORANGE TIMEOUT ST (SAFETY) ×2 IMPLANT
CUFF TOURNIQUET SINGLE 34IN LL (TOURNIQUET CUFF) ×2 IMPLANT
DRAPE ARTHROSCOPY W/POUCH 114 (DRAPES) ×2 IMPLANT
DURAPREP 26ML APPLICATOR (WOUND CARE) ×2 IMPLANT
GAUZE XEROFORM 1X8 LF (GAUZE/BANDAGES/DRESSINGS) ×2 IMPLANT
GLOVE BIO SURGEON STRL SZ8 (GLOVE) ×2 IMPLANT
GLOVE BIOGEL M STRL SZ7.5 (GLOVE) ×1 IMPLANT
GLOVE BIOGEL PI IND STRL 8 (GLOVE) ×2 IMPLANT
GLOVE BIOGEL PI INDICATOR 8 (GLOVE) ×3
GLOVE SURG SS PI 8.0 STRL IVOR (GLOVE) ×2 IMPLANT
GOWN BRE IMP PREV XXLGXLNG (GOWN DISPOSABLE) ×3 IMPLANT
GOWN PREVENTION PLUS XLARGE (GOWN DISPOSABLE) ×2 IMPLANT
HOLDER KNEE FOAM BLUE (MISCELLANEOUS) ×2 IMPLANT
IMMOBILIZER KNEE 22 UNIV (SOFTGOODS) ×1 IMPLANT
IMMOBILIZER KNEE 24 THIGH 36 (MISCELLANEOUS) IMPLANT
IMMOBILIZER KNEE 24 UNIV (MISCELLANEOUS)
KNEE WRAP E Z 3 GEL PACK (MISCELLANEOUS) ×2 IMPLANT
NS IRRIG 1000ML POUR BTL (IV SOLUTION) ×1 IMPLANT
PACK ARTHROSCOPY DSU (CUSTOM PROCEDURE TRAY) ×2 IMPLANT
PACK BASIN DAY SURGERY FS (CUSTOM PROCEDURE TRAY) ×2 IMPLANT
PAD CAST 4YDX4 CTTN HI CHSV (CAST SUPPLIES) IMPLANT
PADDING CAST COTTON 4X4 STRL (CAST SUPPLIES) ×2
PADDING CAST COTTON 6X4 STRL (CAST SUPPLIES) ×2 IMPLANT
SPONGE GAUZE 4X4 12PLY (GAUZE/BANDAGES/DRESSINGS) ×2 IMPLANT
SUT ETHILON 4 0 PS 2 18 (SUTURE) ×2 IMPLANT
TOWEL OR 17X24 6PK STRL BLUE (TOWEL DISPOSABLE) ×2 IMPLANT
TOWEL OR NON WOVEN STRL DISP B (DISPOSABLE) ×2 IMPLANT
TUBE CONNECTING 20X1/4 (TUBING) ×2 IMPLANT
TUBING ARTHROSCOPY IRRIG 16FT (MISCELLANEOUS) ×2 IMPLANT
WAND SHORT BEVEL W/CORD (SURGICAL WAND) ×1 IMPLANT
WAND STAR VAC 90 (SURGICAL WAND) ×1 IMPLANT
WATER STERILE IRR 1000ML POUR (IV SOLUTION) ×2 IMPLANT

## 2011-04-17 NOTE — Discharge Instructions (Signed)
Discharge Instructions After Orthopedic Procedures: ° °*You may feel tired and weak following your procedure. It is recommended that you limit physical activity for the next 24 hours and rest at home for the remainder of today and tomorrow. °*No strenuous activity should be started without your doctor's permission. ° °Elevate the extremity that you had surgery on to a level above your heart. This should continue for 48 hours or as instructed by your doctor. ° °If you had hand, arm or shoulder surgery you should move your fingers frequently unless otherwise instructed by your doctor. ° °If you had foot, knee or leg surgery you should wiggle your toes frequently unless otherwise instructed by your doctor. ° °Follow your doctor's exact instructions for activity at home. Use your home equipment as instructed. (Crutches, hard shoes, slings etc.) ° °Limit your activity as instructed by your doctor. ° °Report to your doctor should any of the following occur: °1. Extreme swelling of your fingers or toes. °2. Inability to wiggle your fingers or toes. °3. Coldness, pale or bluish color in your fingers or toes. °4. Loss of sensation, numbness or tingling of your fingers or toes. °5. Unusual smell or odor from under your dressing or cast. °6. Excessive bleeding or drainage from the surgical site. °7. Pain not relieved by medication your doctor has prescribed for you. °8. Cast or dressing too tight (do not get your dressing or cast wet or put anything under          your dressing or cast.) ° °*Do not change your dressing unless instructed by your doctor or discharge nurse. Then follow exact instructions. ° °*Follow labeled instructions for any medications that your doctor may have prescribed for you. °*Should any questions or complications develop following your procedure, PLEASE CONTACT YOUR DOCTOR. ° ° °Regional Anesthesia Blocks ° °1. Numbness or the inability to move the "blocked" extremity may last from 3-48 hours after  placement. The length of time depends on the medication injected and your individual response to the medication. If the numbness is not going away after 48 hours, call your surgeon. ° °2. The extremity that is blocked will need to be protected until the numbness is gone and the  Strength has returned. Because you cannot feel it, you will need to take extra care to avoid injury. Because it may be weak, you may have difficulty moving it or using it. You may not know what position it is in without looking at it while the block is in effect. ° °3. For blocks in the legs and feet, returning to weight bearing and walking needs to be done carefully. You will need to wait until the numbness is entirely gone and the strength has returned. You should be able to move your leg and foot normally before you try and bear weight or walk. You will need someone to be with you when you first try to ensure you do not fall and possibly risk injury. ° °4. Bruising and tenderness at the needle site are common side effects and will resolve in a few days. ° °5. Persistent numbness or new problems with movement should be communicated to the surgeon or the [facility] ([phone])/ [facility] ([phone]). ° ° °Post Anesthesia Home Care Instructions ° °Activity: °Get plenty of rest for the remainder of the day. A responsible adult should stay with you for 24 hours following the procedure.  °For the next 24 hours, DO NOT: °-Drive a car °-Operate machinery °-  Drink alcoholic beverages °-Take any medication unless instructed by your physician °-Make any legal decisions or sign important papers. ° °Meals: °Start with liquid foods such as gelatin or soup. Progress to regular foods as tolerated. Avoid greasy, spicy, heavy foods. If nausea and/or vomiting occur, drink only clear liquids until the nausea and/or vomiting subsides. Call your physician if vomiting continues. ° °Special Instructions/Symptoms: °Your throat may  feel dry or sore from the anesthesia or the breathing tube placed in your throat during surgery. If this causes discomfort, gargle with warm salt water. The discomfort should disappear within 24 hours. ° °

## 2011-04-17 NOTE — Anesthesia Procedure Notes (Signed)
Procedure Name: LMA Insertion Date/Time: 04/17/2011 11:32 AM Performed by: Zenia Resides D Pre-anesthesia Checklist: Patient identified, Emergency Drugs available, Suction available, Patient being monitored and Timeout performed Patient Re-evaluated:Patient Re-evaluated prior to inductionOxygen Delivery Method: Circle System Utilized Preoxygenation: Pre-oxygenation with 100% oxygen Intubation Type: IV induction Ventilation: Mask ventilation without difficulty LMA: LMA with gastric port inserted LMA Size: 4.0 Number of attempts: 1 Placement Confirmation: positive ETCO2 and breath sounds checked- equal and bilateral Tube secured with: Tape Dental Injury: Teeth and Oropharynx as per pre-operative assessment

## 2011-04-17 NOTE — Anesthesia Postprocedure Evaluation (Signed)
  Anesthesia Post-op Note  Patient: Angel Mendez  Procedure(s) Performed: Procedure(s) (LRB): ARTHROSCOPY KNEE (Right)  Patient Location: PACU  Anesthesia Type: General  Level of Consciousness: awake  Airway and Oxygen Therapy: Patient Spontanous Breathing  Post-op Pain: mild  Post-op Assessment: Post-op Vital signs reviewed, Patient's Cardiovascular Status Stable, Respiratory Function Stable, Patent Airway, No signs of Nausea or vomiting and Pain level controlled  Post-op Vital Signs: stable  Complications: No apparent anesthesia complications

## 2011-04-17 NOTE — Transfer of Care (Signed)
Immediate Anesthesia Transfer of Care Note  Patient: Angel Mendez  Procedure(s) Performed: Procedure(s) (LRB): ARTHROSCOPY KNEE (Right)  Patient Location: PACU  Anesthesia Type: General  Level of Consciousness: awake, alert  and oriented  Airway & Oxygen Therapy: Patient Spontanous Breathing and Patient connected to face mask oxygen  Post-op Assessment: Report given to PACU RN and Post -op Vital signs reviewed and stable  Post vital signs: Reviewed and stable  Complications: No apparent anesthesia complications

## 2011-04-17 NOTE — Brief Op Note (Signed)
04/17/2011  12:52 PM  PATIENT:  Severiano Gilbert  46 y.o. female  PRE-OPERATIVE DIAGNOSIS:  Right knee meniscal tear  POST-OPERATIVE DIAGNOSIS:  Right knee meniscal tear  PROCEDURE:  Procedure(s) (LRB): ARTHROSCOPY KNEE (Right)  SURGEON:  Surgeon(s) and Role:    * Sherri Rad, MD - Primary  PHYSICIAN ASSISTANT: Rexene Edison, PAC   ASSISTANTS: Above   ANESTHESIA:   general  EBL:  Total I/O In: 1000 [I.V.:1000] Out: -   BLOOD ADMINISTERED:none  DRAINS: none   LOCAL MEDICATIONS USED:  MARCAINE     SPECIMEN:  No Specimen  DISPOSITION OF SPECIMEN:  N/A  COUNTS:  YES  TOURNIQUET:  * Missing tourniquet times found for documented tourniquets in log:  30718 *  DICTATION: .Other Dictation: Dictation Number [id number]  PLAN OF CARE: Discharge to home after PACU  PATIENT DISPOSITION:  PACU - hemodynamically stable.   Delay start of Pharmacological VTE agent (>24hrs) due to surgical blood loss or risk of bleeding: no

## 2011-04-17 NOTE — Anesthesia Preprocedure Evaluation (Signed)
Anesthesia Evaluation  Patient identified by MRN, date of birth, ID band Patient awake    Reviewed: Allergy & Precautions, H&P , NPO status , Patient's Chart, lab work & pertinent test results  Airway Mallampati: I TM Distance: >3 FB Neck ROM: Full    Dental  (+) Edentulous Upper   Pulmonary    Pulmonary exam normal       Cardiovascular     Neuro/Psych Depression    GI/Hepatic GERD-  Medicated and Controlled,  Endo/Other  Diabetes mellitus-  Renal/GU      Musculoskeletal   Abdominal (+) + obese,   Peds  Hematology   Anesthesia Other Findings   Reproductive/Obstetrics                           Anesthesia Physical Anesthesia Plan  ASA: III  Anesthesia Plan: General   Post-op Pain Management:    Induction: Intravenous  Airway Management Planned: LMA  Additional Equipment:   Intra-op Plan:   Post-operative Plan: Extubation in OR  Informed Consent: I have reviewed the patients History and Physical, chart, labs and discussed the procedure including the risks, benefits and alternatives for the proposed anesthesia with the patient or authorized representative who has indicated his/her understanding and acceptance.     Plan Discussed with: CRNA and Surgeon  Anesthesia Plan Comments:         Anesthesia Quick Evaluation

## 2011-04-17 NOTE — H&P (Signed)
  H&P documentation: Placed to be scanned history and physical exam in chart.  -History and Physical Reviewed  -Patient has been re-examined  -No change in the plan of care  Angel Mendez A  

## 2011-04-18 ENCOUNTER — Encounter (HOSPITAL_BASED_OUTPATIENT_CLINIC_OR_DEPARTMENT_OTHER): Payer: Self-pay | Admitting: Orthopedic Surgery

## 2011-04-18 LAB — POCT HEMOGLOBIN-HEMACUE: Hemoglobin: 13.3 g/dL (ref 12.0–15.0)

## 2011-04-18 NOTE — Op Note (Signed)
NAME:  Angel Mendez, FERNER NO.:  MEDICAL RECORD NO.:  [id number]  LOCATION:                                 FACILITY:  PHYSICIAN:  Leonides Grills, M.D.     DATE OF BIRTH:  1966/01/05  DATE OF PROCEDURE:  04/17/2011 DATE OF DISCHARGE:                              OPERATIVE REPORT   PREOPERATIVE DIAGNOSIS:  Right medial meniscal tear.  POSTOPERATIVE DIAGNOSIS:  Right medial meniscal tear.  Medial gutter impingement secondary to the plica and inflammation.  ANESTHESIA:  General  SURGEON:  Leonides Grills, MD  ASSISTANT:  Richardean Canal, PA-C  ESTIMATED BLOOD LOSS:  Minimal.  TOURNIQUET TIME:  Approximately an hour.  COMPLICATIONS:  None.  DISPOSITION:  Stable to PR.  INDICATION:  This is a 46 year old female who has had long-standing medial right knee pain due to the above pathology.  It was interfering with her life to the point where she cannot do what she wants to do despite conservative management.  She was consented to the above procedure.  All risks of infection, nerve or vessel injury, , persistent pain, worse pain, prolonged recovery, stiffness, arthritis, wound healing problems, DVT, PE were all explained.  Questions were encouraged and answered.  OPERATION:  The patient was brought to the operating room and placed in supine position.  After adequate general anesthesia was administered as well as Ancef 1 g IV piggyback, the right lower extremity was then prepped and draped in a sterile manner over a proximally placed thigh tourniquet and a thigh stirrup.  The limb was then gravity exsanguinated and tourniquet was elevated to 290 mmHg. A spinal needle was then placed into the knee joint.  A 20 mL normal saline was instill in to the knee. We then used the nick-and-spread technique to create the superior medial portal.  Nick-and-spread technique was carried down to the suprapatellar pouch.  Blunt-tip trocar with cannula was then placed and inflow  was established.  We then made a portal anteromedially.  With a nick-and- spread technique, medial to the patella tendon.  Blunt-tip trocar with cannula followed by camera was then placed into the knee and then we proceeded to place the knee into an extended position and went into the suprapatellar pouch.  There was a large amount of synovitis in this area.  We then placed a shaver through the superior medial portal and debrided the synovitis in the superior patellofemoral joint and extended into the medial gutter of the knee.  There was a plica in this area that has a lot of synovitis around this area as well.  This was also debrided.  Once this was all done, the entire medial gutter of the knee was completely debrided.  We ranged the knee and there was no impingement.  We then created a anterolateral portal of the knee with a spinal needle followed by nick-and-spread technique.  We then trimmed the synovitis within the knee joint itself.  The ACL was intact.  We then went into the lateral gutter and the lateral meniscus was intact. There were minor mild arthritic changes of the joint in this area.  We then entered the medial compartment of the knee.  There were arthritic changes of both  the medial femoral condyle and the medial tibial plateau portion of the joint.  The meniscus was looked good and was probed and there was no loosening of the meniscus; however, in the very posterior horn of the medial meniscus, there was a tear.  This was then debrided with a shaver and was trimmed off.  There was just a small flap of the tear.  The majority of the symptoms for this patient were likely from the plica as well as the medial compartment arthritic changes.  This small tear had synovitis around it as well and could have likely added to her medial knee pain.  We then went to the lateral gutter of the knee and explore this and there was no significant pathology in this area as well.  Pictures  were obtained throughout the procedure.  Camera was removed.  Wound was closed with 4-0 nylon stitch.  Sterile dressing was applied.  The patient was stable to PR.     Leonides Grills, M.D.     PB/MEDQ  D:  04/17/2011  T:  04/17/2011  Job:  [id number]

## 2011-04-23 ENCOUNTER — Encounter (HOSPITAL_BASED_OUTPATIENT_CLINIC_OR_DEPARTMENT_OTHER): Payer: Self-pay

## 2011-05-06 ENCOUNTER — Ambulatory Visit: Payer: Medicaid Other | Attending: Orthopedic Surgery | Admitting: Physical Therapy

## 2011-05-06 DIAGNOSIS — M6281 Muscle weakness (generalized): Secondary | ICD-10-CM | POA: Insufficient documentation

## 2011-05-06 DIAGNOSIS — IMO0001 Reserved for inherently not codable concepts without codable children: Secondary | ICD-10-CM | POA: Insufficient documentation

## 2011-05-06 DIAGNOSIS — M25569 Pain in unspecified knee: Secondary | ICD-10-CM | POA: Insufficient documentation

## 2011-05-06 DIAGNOSIS — M25669 Stiffness of unspecified knee, not elsewhere classified: Secondary | ICD-10-CM | POA: Insufficient documentation

## 2011-05-15 ENCOUNTER — Encounter: Payer: Medicaid Other | Admitting: Physical Therapy

## 2011-05-22 ENCOUNTER — Ambulatory Visit: Payer: Medicaid Other | Attending: Orthopedic Surgery | Admitting: Physical Therapy

## 2011-05-22 DIAGNOSIS — M25669 Stiffness of unspecified knee, not elsewhere classified: Secondary | ICD-10-CM | POA: Insufficient documentation

## 2011-05-22 DIAGNOSIS — M25569 Pain in unspecified knee: Secondary | ICD-10-CM | POA: Insufficient documentation

## 2011-05-22 DIAGNOSIS — M6281 Muscle weakness (generalized): Secondary | ICD-10-CM | POA: Insufficient documentation

## 2011-05-22 DIAGNOSIS — IMO0001 Reserved for inherently not codable concepts without codable children: Secondary | ICD-10-CM | POA: Insufficient documentation

## 2011-07-20 ENCOUNTER — Encounter (HOSPITAL_COMMUNITY): Payer: Self-pay | Admitting: *Deleted

## 2011-07-20 ENCOUNTER — Emergency Department (HOSPITAL_COMMUNITY)
Admission: EM | Admit: 2011-07-20 | Discharge: 2011-07-21 | Disposition: A | Discharge status: Home / Self Care | Payer: Medicaid Other | Attending: Emergency Medicine | Admitting: Emergency Medicine

## 2011-07-20 DIAGNOSIS — N390 Urinary tract infection, site not specified: Secondary | ICD-10-CM

## 2011-07-20 DIAGNOSIS — Z79899 Other long term (current) drug therapy: Secondary | ICD-10-CM | POA: Insufficient documentation

## 2011-07-20 DIAGNOSIS — E119 Type 2 diabetes mellitus without complications: Secondary | ICD-10-CM | POA: Insufficient documentation

## 2011-07-20 DIAGNOSIS — F3289 Other specified depressive episodes: Secondary | ICD-10-CM | POA: Insufficient documentation

## 2011-07-20 DIAGNOSIS — R42 Dizziness and giddiness: Secondary | ICD-10-CM | POA: Insufficient documentation

## 2011-07-20 DIAGNOSIS — F17200 Nicotine dependence, unspecified, uncomplicated: Secondary | ICD-10-CM | POA: Insufficient documentation

## 2011-07-20 DIAGNOSIS — Z9109 Other allergy status, other than to drugs and biological substances: Secondary | ICD-10-CM | POA: Insufficient documentation

## 2011-07-20 DIAGNOSIS — K219 Gastro-esophageal reflux disease without esophagitis: Secondary | ICD-10-CM

## 2011-07-20 DIAGNOSIS — F329 Major depressive disorder, single episode, unspecified: Secondary | ICD-10-CM | POA: Insufficient documentation

## 2011-07-20 LAB — URINALYSIS, ROUTINE W REFLEX MICROSCOPIC
Glucose, UA: NEGATIVE mg/dL
Hgb urine dipstick: NEGATIVE
Ketones, ur: 15 mg/dL — AB
Nitrite: NEGATIVE
Protein, ur: NEGATIVE mg/dL
Specific Gravity, Urine: 1.026 (ref 1.005–1.030)
Urobilinogen, UA: 1 mg/dL (ref 0.0–1.0)
pH: 5.5 (ref 5.0–8.0)

## 2011-07-20 LAB — POCT I-STAT, CHEM 8
BUN: 8 mg/dL (ref 6–23)
Calcium, Ion: 1.19 mmol/L (ref 1.12–1.23)
Chloride: 103 mEq/L (ref 96–112)
Creatinine, Ser: 0.9 mg/dL (ref 0.50–1.10)
Glucose, Bld: 100 mg/dL — ABNORMAL HIGH (ref 70–99)
HCT: 47 % — ABNORMAL HIGH (ref 36.0–46.0)
Hemoglobin: 16 g/dL — ABNORMAL HIGH (ref 12.0–15.0)
Potassium: 4.1 mEq/L (ref 3.5–5.1)
Sodium: 138 mEq/L (ref 135–145)
TCO2: 23 mmol/L (ref 0–100)

## 2011-07-20 LAB — GLUCOSE, CAPILLARY: Glucose-Capillary: 126 mg/dL — ABNORMAL HIGH (ref 70–99)

## 2011-07-20 LAB — URINE MICROSCOPIC-ADD ON

## 2011-07-20 LAB — POCT PREGNANCY, URINE: Preg Test, Ur: NEGATIVE

## 2011-07-20 MED ORDER — SODIUM CHLORIDE 0.9 % IV BOLUS (SEPSIS)
1000.0000 mL | Freq: Once | INTRAVENOUS | Status: AC
  Administered 2011-07-20: 1000 mL via INTRAVENOUS

## 2011-07-20 MED ORDER — NITROFURANTOIN MONOHYD MACRO 100 MG PO CAPS: 100.0000 mg | ORAL_CAPSULE | Freq: Two times a day (BID) | ORAL | Status: DC

## 2011-07-20 MED ORDER — PANTOPRAZOLE SODIUM 20 MG PO TBEC: 40.0000 mg | DELAYED_RELEASE_TABLET | Freq: Every day | ORAL | Status: DC

## 2011-07-20 NOTE — ED Provider Notes (Signed)
History     CSN: [id number]  Arrival date & time 07/20/11  2000   First MD Initiated Contact with Patient 07/20/11 2242      Chief Complaint  Patient presents with  . Dizziness    (Consider location/radiation/quality/duration/timing/severity/associated sxs/prior treatment) HPI  46 year old female with history of diabetes presents complaining of dizziness. Patient reports she has been having sensation of lightheadedness, and set of dizziness for the past several days. The symptoms worsen whenever she stands up. She felt as if he was going to pass out but never passed out. She also endorsed cough productive with yellow phlegm for the past several days. Also complaining of burning in her throat, and felt similar to heartburn. Symptoms ongoing and has been there for the past 2 years. She takes pepcid which provide some relief. Patient denies fever, chills, headache, nausea, vomiting, abdominal pain, back pain, urinary symptoms. She has no chest pain or shortness of breath. Reports a she was constipated 3 days ago, took Milk of Magneum and has had multiple loose stool.  Denies blood in stool, hemoptysis, hematemesis, or melena. Pt reports she recently started back on her psych meds including abilify, zoloft, and trazodone this month.  Does not relate her sxs with her medication.    Past Medical History  Diagnosis Date  . Seasonal allergies   . GERD (gastroesophageal reflux disease)     no current med.  . Depression     no current med.  . Diabetes mellitus     NIDDM  . Meniscus tear 04/2011    right knee    Past Surgical History  Procedure Date  . No past surgeries   . Knee arthroscopy 04/17/2011    Procedure: ARTHROSCOPY KNEE;  Surgeon: Sherri Rad, MD;  Location: [facility];  Service: Orthopedics;  Laterality: Right;  Removal of Plica and Medial Menical Debridement    History reviewed. No pertinent family history.  History  Substance Use Topics  . Smoking  status: Current Everyday Smoker -- 0.5 packs/day for 15 years    Types: Cigarettes  . Smokeless tobacco: Never Used   Comment: 5 cig./day  . Alcohol Use: Yes     occasionally    OB History    Grav Para Term Preterm Abortions TAB SAB Ect Mult Living                  Review of Systems  All other systems reviewed and are negative.    Allergies  Review of patient's allergies indicates no known allergies.  Home Medications   Current Outpatient Rx  Name Route Sig Dispense Refill  . ARIPIPRAZOLE 10 MG PO TABS Oral Take 10 mg by mouth daily.    . IBUPROFEN 200 MG PO TABS Oral Take 200 mg by mouth every 6 (six) hours as needed. For pain    . METFORMIN HCL 850 MG PO TABS Oral Take 850 mg by mouth every evening.     Marland Kitchen NAPROXEN SODIUM 220 MG PO TABS Oral Take 220 mg by mouth 2 (two) times daily as needed. For pain    . SERTRALINE HCL 50 MG PO TABS Oral Take 50 mg by mouth daily.    . TRAZODONE HCL 100 MG PO TABS Oral Take 100 mg by mouth at bedtime.    Marland Kitchen VITAMIN C 500 MG PO TABS Oral Take 1 tablet (500 mg total) by mouth daily. 90 tablet 0    BP 103/68  Pulse 85  Temp 98.6  F (37 C) (Oral)  Resp 20  SpO2 97%  Physical Exam  Nursing note and vitals reviewed. Constitutional: She is oriented to person, place, and time. She appears well-developed and well-nourished. No distress.       Awake, alert, nontoxic appearance  HENT:  Head: Atraumatic.  Right Ear: Hearing, tympanic membrane, external ear and ear canal normal.  Left Ear: Hearing, tympanic membrane, external ear and ear canal normal.  Nose: Nose normal.  Mouth/Throat: Uvula is midline, oropharynx is clear and moist and mucous membranes are normal.  Eyes: Conjunctivae are normal. Right eye exhibits no discharge. Left eye exhibits no discharge.       No nystagmus  Neck: Neck supple.  Cardiovascular: Normal rate and regular rhythm.   Pulmonary/Chest: Effort normal. No respiratory distress. She exhibits no tenderness.    Abdominal: Soft. There is no tenderness. There is no rebound and no CVA tenderness.  Musculoskeletal: She exhibits no edema and no tenderness.       ROM appears intact, no obvious focal weakness  Neurological: She is alert and oriented to person, place, and time. She has normal strength. No cranial nerve deficit or sensory deficit. She displays a negative Romberg sign. Coordination and gait normal. GCS eye subscore is 4. GCS verbal subscore is 5. GCS motor subscore is 6.       Mental status and motor strength appears intact  Skin: No rash noted.  Psychiatric: She has a normal mood and affect.    ED Course  Procedures (including critical care time)  Labs Reviewed  URINALYSIS, ROUTINE W REFLEX MICROSCOPIC - Abnormal; Notable for the following:    Color, Urine AMBER (*)  BIOCHEMICALS MAY BE AFFECTED BY COLOR   APPearance CLOUDY (*)     Bilirubin Urine SMALL (*)     Ketones, ur 15 (*)     Leukocytes, UA MODERATE (*)     All other components within normal limits  GLUCOSE, CAPILLARY - Abnormal; Notable for the following:    Glucose-Capillary 126 (*)     All other components within normal limits  URINE MICROSCOPIC-ADD ON - Abnormal; Notable for the following:    Squamous Epithelial / LPF MANY (*)     Bacteria, UA FEW (*)     Crystals CA OXALATE CRYSTALS (*)     All other components within normal limits  POCT PREGNANCY, URINE   Results for orders placed during the hospital encounter of 07/20/11  URINALYSIS, ROUTINE W REFLEX MICROSCOPIC      Component Value Range   Color, Urine AMBER (*) YELLOW   APPearance CLOUDY (*) CLEAR   Specific Gravity, Urine 1.026  1.005 - 1.030   pH 5.5  5.0 - 8.0   Glucose, UA NEGATIVE  NEGATIVE mg/dL   Hgb urine dipstick NEGATIVE  NEGATIVE   Bilirubin Urine SMALL (*) NEGATIVE   Ketones, ur 15 (*) NEGATIVE mg/dL   Protein, ur NEGATIVE  NEGATIVE mg/dL   Urobilinogen, UA 1.0  0.0 - 1.0 mg/dL   Nitrite NEGATIVE  NEGATIVE   Leukocytes, UA MODERATE (*)  NEGATIVE  GLUCOSE, CAPILLARY      Component Value Range   Glucose-Capillary 126 (*) 70 - 99 mg/dL  POCT PREGNANCY, URINE      Component Value Range   Preg Test, Ur NEGATIVE  NEGATIVE  URINE MICROSCOPIC-ADD ON      Component Value Range   Squamous Epithelial / LPF MANY (*) RARE   WBC, UA 7-10  <3 WBC/hpf   RBC /  HPF 0-2  <3 RBC/hpf   Bacteria, UA FEW (*) RARE   Crystals CA OXALATE CRYSTALS (*) NEGATIVE   Urine-Other MUCOUS PRESENT    POCT I-STAT, CHEM 8      Component Value Range   Sodium 138  135 - 145 mEq/L   Potassium 4.1  3.5 - 5.1 mEq/L   Chloride 103  96 - 112 mEq/L   BUN 8  6 - 23 mg/dL   Creatinine, Ser 4.09  0.50 - 1.10 mg/dL   Glucose, Bld 811 (*) 70 - 99 mg/dL   Calcium, Ion 9.14  7.82 - 1.23 mmol/L   TCO2 23  0 - 100 mmol/L   Hemoglobin 16.0 (*) 12.0 - 15.0 g/dL   HCT 95.6 (*) 21.3 - 08.6 %   No results found.   1. GERD 2. UTI   MDM  Pt c/o lightheadedness.  No vertiginous sxs.  Examination unremarkable.  No abd pain.  UA shows evidence of UTI although pt denies sxs.  Urine culture sent.  Pregnancy test neg.  CBG 126.    Will check orthostatic VS.    11:53 PM Normal orthostatic vital sign.  No evidence of anemia.  Normal electrolytes, no evidence of hyperglycemia.  VSS, pt in NAD, no focal neuro deficits, normal gait, no nystagmus.  Plan to treat UTI with macrobid, and give protonix for heart burn. Pt agrees with plan and pt also will f/u with Dr. Mikeal Hawthorne.  Strict return precaution discussed.        Fayrene Helper, PA-C 07/20/11 2357

## 2011-07-20 NOTE — ED Notes (Signed)
Pt c/o dizziness and cough for the past 3 days.  Also c/o throat pain that she states has been ongoing for 2 years.  Denies n/v, diarrhea, abdominal pain, CP, SOB.  Last cbg this morning was 217.

## 2011-07-20 NOTE — ED Notes (Signed)
Up to the bathroom 

## 2011-07-21 MED ORDER — NITROFURANTOIN MONOHYD MACRO 100 MG PO CAPS: 100.0000 mg | ORAL_CAPSULE | Freq: Two times a day (BID) | ORAL | Status: AC

## 2011-07-21 MED ORDER — PANTOPRAZOLE SODIUM 20 MG PO TBEC: 40.0000 mg | DELAYED_RELEASE_TABLET | Freq: Every day | ORAL | Status: DC

## 2011-07-22 LAB — URINE CULTURE: Colony Count: 65000

## 2011-07-25 NOTE — ED Provider Notes (Signed)
Medical screening examination/treatment/procedure(s) were performed by non-physician practitioner and as supervising physician I was immediately available for consultation/collaboration.  Raeford Razor, MD 07/25/11 [phone]

## 2012-01-08 ENCOUNTER — Encounter (HOSPITAL_COMMUNITY): Payer: Self-pay | Admitting: Emergency Medicine

## 2012-01-08 ENCOUNTER — Emergency Department (HOSPITAL_COMMUNITY): Payer: Medicaid Other

## 2012-01-08 ENCOUNTER — Inpatient Hospital Stay (HOSPITAL_COMMUNITY)
Admission: EM | Admit: 2012-01-08 | Discharge: 2012-01-11 | DRG: 918 | Disposition: A | Discharge status: Other Acute Inpatient Hospital | Payer: Medicaid Other | Attending: Internal Medicine | Admitting: Internal Medicine

## 2012-01-08 DIAGNOSIS — R0789 Other chest pain: Secondary | ICD-10-CM | POA: Diagnosis not present

## 2012-01-08 DIAGNOSIS — R45851 Suicidal ideations: Secondary | ICD-10-CM

## 2012-01-08 DIAGNOSIS — F32A Depression, unspecified: Secondary | ICD-10-CM | POA: Diagnosis present

## 2012-01-08 DIAGNOSIS — F329 Major depressive disorder, single episode, unspecified: Secondary | ICD-10-CM

## 2012-01-08 DIAGNOSIS — T383X1A Poisoning by insulin and oral hypoglycemic [antidiabetic] drugs, accidental (unintentional), initial encounter: Principal | ICD-10-CM | POA: Diagnosis present

## 2012-01-08 DIAGNOSIS — K219 Gastro-esophageal reflux disease without esophagitis: Secondary | ICD-10-CM | POA: Diagnosis present

## 2012-01-08 DIAGNOSIS — F1020 Alcohol dependence, uncomplicated: Secondary | ICD-10-CM | POA: Diagnosis present

## 2012-01-08 DIAGNOSIS — T5492XA Toxic effect of unspecified corrosive substance, intentional self-harm, initial encounter: Secondary | ICD-10-CM | POA: Diagnosis present

## 2012-01-08 DIAGNOSIS — F4310 Post-traumatic stress disorder, unspecified: Secondary | ICD-10-CM | POA: Diagnosis present

## 2012-01-08 DIAGNOSIS — Y92009 Unspecified place in unspecified non-institutional (private) residence as the place of occurrence of the external cause: Secondary | ICD-10-CM

## 2012-01-08 DIAGNOSIS — T1491XA Suicide attempt, initial encounter: Secondary | ICD-10-CM | POA: Diagnosis present

## 2012-01-08 DIAGNOSIS — IMO0002 Reserved for concepts with insufficient information to code with codable children: Secondary | ICD-10-CM

## 2012-01-08 DIAGNOSIS — T5491XA Toxic effect of unspecified corrosive substance, accidental (unintentional), initial encounter: Secondary | ICD-10-CM | POA: Diagnosis present

## 2012-01-08 DIAGNOSIS — X838XXA Intentional self-harm by other specified means, initial encounter: Secondary | ICD-10-CM

## 2012-01-08 DIAGNOSIS — T50992A Poisoning by other drugs, medicaments and biological substances, intentional self-harm, initial encounter: Secondary | ICD-10-CM | POA: Diagnosis present

## 2012-01-08 DIAGNOSIS — J309 Allergic rhinitis, unspecified: Secondary | ICD-10-CM | POA: Diagnosis present

## 2012-01-08 DIAGNOSIS — Z79899 Other long term (current) drug therapy: Secondary | ICD-10-CM

## 2012-01-08 DIAGNOSIS — F3289 Other specified depressive episodes: Secondary | ICD-10-CM | POA: Diagnosis present

## 2012-01-08 DIAGNOSIS — E119 Type 2 diabetes mellitus without complications: Secondary | ICD-10-CM | POA: Diagnosis present

## 2012-01-08 DIAGNOSIS — K2960 Other gastritis without bleeding: Secondary | ICD-10-CM | POA: Diagnosis present

## 2012-01-08 DIAGNOSIS — F17200 Nicotine dependence, unspecified, uncomplicated: Secondary | ICD-10-CM | POA: Diagnosis present

## 2012-01-08 LAB — COMPREHENSIVE METABOLIC PANEL
ALT: 51 U/L — ABNORMAL HIGH (ref 0–35)
AST: 61 U/L — ABNORMAL HIGH (ref 0–37)
Albumin: 3.8 g/dL (ref 3.5–5.2)
Alkaline Phosphatase: 96 U/L (ref 39–117)
BUN: 4 mg/dL — ABNORMAL LOW (ref 6–23)
CO2: 19 mEq/L (ref 19–32)
Calcium: 9.4 mg/dL (ref 8.4–10.5)
Chloride: 104 mEq/L (ref 96–112)
Creatinine, Ser: 0.8 mg/dL (ref 0.50–1.10)
GFR calc Af Amer: >90 mL/min (ref >90)
GFR calc non Af Amer: 88 mL/min — ABNORMAL LOW (ref >90)
Glucose, Bld: 155 mg/dL — ABNORMAL HIGH (ref 70–99)
Potassium: 4 mEq/L (ref 3.5–5.1)
Sodium: 138 mEq/L (ref 135–145)
Total Bilirubin: 0.3 mg/dL (ref 0.3–1.2)
Total Protein: 7.9 g/dL (ref 6.0–8.3)

## 2012-01-08 LAB — CBC WITH DIFFERENTIAL/PLATELET
Basophils Absolute: 0 10*3/uL (ref 0.0–0.1)
Basophils Relative: 0 % (ref 0–1)
Eosinophils Absolute: 0.2 10*3/uL (ref 0.0–0.7)
Eosinophils Relative: 1 % (ref 0–5)
HCT: 46.1 % — ABNORMAL HIGH (ref 36.0–46.0)
Hemoglobin: 16.1 g/dL — ABNORMAL HIGH (ref 12.0–15.0)
Lymphocytes Relative: 47 % — ABNORMAL HIGH (ref 12–46)
Lymphs Abs: 7.5 10*3/uL — ABNORMAL HIGH (ref 0.7–4.0)
MCH: 32.5 pg (ref 26.0–34.0)
MCHC: 34.9 g/dL (ref 30.0–36.0)
MCV: 92.9 fL (ref 78.0–100.0)
Monocytes Absolute: 0.8 10*3/uL (ref 0.1–1.0)
Monocytes Relative: 5 % (ref 3–12)
Neutro Abs: 7.5 10*3/uL (ref 1.7–7.7)
Neutrophils Relative %: 47 % (ref 43–77)
Platelets: 249 10*3/uL (ref 150–400)
RBC: 4.96 MIL/uL (ref 3.87–5.11)
RDW: 12.8 % (ref 11.5–15.5)
WBC: 16 10*3/uL — ABNORMAL HIGH (ref 4.0–10.5)

## 2012-01-08 LAB — BLOOD GAS, VENOUS
Acid-base deficit: 5.4 mmol/L — ABNORMAL HIGH (ref 0.0–2.0)
Bicarbonate: 20.5 mEq/L (ref 20.0–24.0)
FIO2: 0.21 %
O2 Saturation: 51.1 %
Patient temperature: 98.6
TCO2: 18.4 mmol/L (ref 0–100)
pCO2, Ven: 43 mmHg — ABNORMAL LOW (ref 45.0–50.0)
pH, Ven: 7.3 (ref 7.250–7.300)
pO2, Ven: 31.6 mmHg (ref 30.0–45.0)

## 2012-01-08 LAB — ACETAMINOPHEN LEVEL: Acetaminophen (Tylenol), Serum: <15 ug/mL (ref 10–30)

## 2012-01-08 LAB — GLUCOSE, CAPILLARY: Glucose-Capillary: 141 mg/dL — ABNORMAL HIGH (ref 70–99)

## 2012-01-08 LAB — LIPASE, BLOOD: Lipase: 27 U/L (ref 11–59)

## 2012-01-08 LAB — LACTIC ACID, PLASMA: Lactic Acid, Venous: 0.2 mmol/L — ABNORMAL LOW (ref 0.5–2.2)

## 2012-01-08 LAB — SALICYLATE LEVEL: Salicylate Lvl: <2 mg/dL — ABNORMAL LOW (ref 2.8–20.0)

## 2012-01-08 LAB — ETHANOL: Alcohol, Ethyl (B): 186 mg/dL — ABNORMAL HIGH (ref 0–11)

## 2012-01-08 MED ORDER — TETANUS-DIPHTH-ACELL PERTUSSIS 5-2.5-18.5 LF-MCG/0.5 IM SUSP
0.5000 mL | Freq: Once | INTRAMUSCULAR | Status: AC
  Administered 2012-01-08: 0.5 mL via INTRAMUSCULAR
  Filled 2012-01-08: qty 0.5

## 2012-01-08 MED ORDER — ONDANSETRON HCL 4 MG/2ML IJ SOLN
4.0000 mg | Freq: Four times a day (QID) | INTRAMUSCULAR | Status: DC | PRN
  Administered 2012-01-09: 4 mg via INTRAVENOUS
  Filled 2012-01-08: qty 2

## 2012-01-08 MED ORDER — TRAZODONE HCL 100 MG PO TABS
100.0000 mg | ORAL_TABLET | Freq: Every day | ORAL | Status: DC
  Administered 2012-01-08 – 2012-01-10 (×3): 100 mg via ORAL
  Filled 2012-01-08 (×5): qty 1

## 2012-01-08 MED ORDER — SODIUM CHLORIDE 0.9 % IV SOLN
INTRAVENOUS | Status: DC
  Administered 2012-01-08 – 2012-01-10 (×4): via INTRAVENOUS

## 2012-01-08 MED ORDER — SODIUM CHLORIDE 0.9 % IV BOLUS (SEPSIS)
1000.0000 mL | Freq: Once | INTRAVENOUS | Status: AC
  Administered 2012-01-08: 1000 mL via INTRAVENOUS

## 2012-01-08 MED ORDER — ENOXAPARIN SODIUM 40 MG/0.4ML ~~LOC~~ SOLN
40.0000 mg | Freq: Every day | SUBCUTANEOUS | Status: DC
  Administered 2012-01-08 – 2012-01-10 (×3): 40 mg via SUBCUTANEOUS
  Filled 2012-01-08 (×4): qty 0.4

## 2012-01-08 MED ORDER — INSULIN ASPART 100 UNIT/ML ~~LOC~~ SOLN
0.0000 [IU] | SUBCUTANEOUS | Status: DC
  Administered 2012-01-10: 1 [IU] via SUBCUTANEOUS

## 2012-01-08 MED ORDER — SODIUM CHLORIDE 0.9 % IV SOLN: INTRAVENOUS | Status: DC

## 2012-01-08 MED ORDER — ONDANSETRON HCL 4 MG/2ML IJ SOLN
4.0000 mg | Freq: Once | INTRAMUSCULAR | Status: AC
  Administered 2012-01-08: 4 mg via INTRAVENOUS
  Filled 2012-01-08: qty 2

## 2012-01-08 MED ORDER — ONDANSETRON HCL 4 MG/2ML IJ SOLN: 4.0000 mg | Freq: Three times a day (TID) | INTRAMUSCULAR | Status: DC | PRN

## 2012-01-08 MED ORDER — PANTOPRAZOLE SODIUM 40 MG IV SOLR
40.0000 mg | Freq: Two times a day (BID) | INTRAVENOUS | Status: DC
  Administered 2012-01-08 – 2012-01-10 (×4): 40 mg via INTRAVENOUS
  Filled 2012-01-08 (×5): qty 40

## 2012-01-08 MED ORDER — MORPHINE SULFATE 2 MG/ML IJ SOLN: 2.0000 mg | INTRAMUSCULAR | Status: DC | PRN

## 2012-01-08 MED ORDER — SERTRALINE HCL 50 MG PO TABS
50.0000 mg | ORAL_TABLET | Freq: Every day | ORAL | Status: DC
  Administered 2012-01-09 – 2012-01-11 (×3): 50 mg via ORAL
  Filled 2012-01-08 (×3): qty 1

## 2012-01-08 MED ORDER — ARIPIPRAZOLE 10 MG PO TABS
10.0000 mg | ORAL_TABLET | Freq: Every day | ORAL | Status: DC
  Administered 2012-01-09 – 2012-01-11 (×3): 10 mg via ORAL
  Filled 2012-01-08 (×3): qty 1

## 2012-01-08 MED ORDER — ONDANSETRON HCL 4 MG PO TABS: 4.0000 mg | ORAL_TABLET | Freq: Four times a day (QID) | ORAL | Status: DC | PRN

## 2012-01-08 MED ORDER — FENTANYL CITRATE 0.05 MG/ML IJ SOLN: 50.0000 ug | Freq: Once | INTRAMUSCULAR | Status: DC

## 2012-01-08 MED ORDER — SODIUM CHLORIDE 0.9 % IJ SOLN
3.0000 mL | Freq: Two times a day (BID) | INTRAMUSCULAR | Status: DC
  Administered 2012-01-09: 3 mL via INTRAVENOUS

## 2012-01-08 MED ORDER — HYDROMORPHONE HCL PF 1 MG/ML IJ SOLN: 1.0000 mg | INTRAMUSCULAR | Status: DC | PRN

## 2012-01-08 NOTE — ED Notes (Signed)
Pt is aware of the need for urine. 

## 2012-01-08 NOTE — ED Provider Notes (Signed)
History     CSN: [id number]  Arrival date & time 01/08/12  1856   First MD Initiated Contact with Patient 01/08/12 1904      Chief Complaint  Patient presents with  . V70.1    (Consider location/radiation/quality/duration/timing/severity/associated sxs/prior treatment) The history is provided by the patient.  Angel Mendez is a 47 y.o. female hx of depression here with suicidal attempt. She says she is feeling depressed and has been drinking alcohol since yesterday. She also try and cut her left wrist this morning. She was very depressed around 4:30 and took 28 pills of her metformin. She also ingested one cup of bleach and some beer afterwards. Patient is very tearful and has suicidal ideations currently. Felt nauseous and currently vomiting.   Level V caveat- intoxication    Past Medical History  Diagnosis Date  . Seasonal allergies   . GERD (gastroesophageal reflux disease)     no current med.  . Depression     no current med.  . Diabetes mellitus     NIDDM  . Meniscus tear 04/2011    right knee    Past Surgical History  Procedure Date  . No past surgeries   . Knee arthroscopy 04/17/2011    Procedure: ARTHROSCOPY KNEE;  Surgeon: Sherri Rad, MD;  Location: [facility];  Service: Orthopedics;  Laterality: Right;  Removal of Plica and Medial Menical Debridement    History reviewed. No pertinent family history.  History  Substance Use Topics  . Smoking status: Current Every Day Smoker -- 0.5 packs/day for 15 years    Types: Cigarettes  . Smokeless tobacco: Never Used     Comment: 5 cig./day  . Alcohol Use: Yes     Comment: occasionally    OB History    Grav Para Term Preterm Abortions TAB SAB Ect Mult Living                  Review of Systems  Gastrointestinal: Positive for vomiting.  Psychiatric/Behavioral: Positive for suicidal ideas and self-injury.  All other systems reviewed and are negative.    Allergies  Review of  patient's allergies indicates no known allergies.  Home Medications   Current Outpatient Rx  Name  Route  Sig  Dispense  Refill  . ARIPIPRAZOLE 10 MG PO TABS   Oral   Take 10 mg by mouth daily.         . IBUPROFEN 200 MG PO TABS   Oral   Take 200 mg by mouth every 6 (six) hours as needed. For pain         . METFORMIN HCL 850 MG PO TABS   Oral   Take 850 mg by mouth every evening.          Marland Kitchen NAPROXEN SODIUM 220 MG PO TABS   Oral   Take 220 mg by mouth 2 (two) times daily as needed. For pain         . PANTOPRAZOLE SODIUM 20 MG PO TBEC   Oral   Take 2 tablets (40 mg total) by mouth daily.   20 tablet   0   . SERTRALINE HCL 50 MG PO TABS   Oral   Take 50 mg by mouth daily.         . TRAZODONE HCL 100 MG PO TABS   Oral   Take 100 mg by mouth at bedtime.         Marland Kitchen VITAMIN C 500 MG  PO TABS   Oral   Take 1 tablet (500 mg total) by mouth daily.   90 tablet   0     BP 116/76  Pulse 99  Temp 98.4 F (36.9 C) (Oral)  Resp 22  SpO2 99%  LMP 12/09/2011  Physical Exam  Nursing note and vitals reviewed. Constitutional: She is oriented to person, place, and time.       Intoxicated, tearful, vomiting   HENT:  Head: Normocephalic.  Mouth/Throat: Oropharynx is clear and moist.  Eyes: Conjunctivae normal are normal. Pupils are equal, round, and reactive to light.  Neck: Normal range of motion. Neck supple.  Cardiovascular: Normal rate, regular rhythm and normal heart sounds.   Pulmonary/Chest: Effort normal and breath sounds normal. No respiratory distress. She has no wheezes. She has no rales.  Abdominal: Soft.       Diffusely tender. Patient refused to have me examine her abdomen because of tenderness.   Musculoskeletal: Normal range of motion.  Neurological: She is alert and oriented to person, place, and time.  Skin: Skin is warm and dry.       L wrist with linear abrasion, no active bleeding   Psychiatric:       Depressed, poor judgment.     ED  Course  Procedures (including critical care time)  Labs Reviewed  GLUCOSE, CAPILLARY - Abnormal; Notable for the following:    Glucose-Capillary 141 (*)     All other components within normal limits  CBC WITH DIFFERENTIAL - Abnormal; Notable for the following:    WBC 16.0 (*)     Hemoglobin 16.1 (*)     HCT 46.1 (*)     Lymphocytes Relative 47 (*)     Lymphs Abs 7.5 (*)     All other components within normal limits  COMPREHENSIVE METABOLIC PANEL - Abnormal; Notable for the following:    Glucose, Bld 155 (*)     BUN 4 (*)     AST 61 (*)     ALT 51 (*)     GFR calc non Af Amer 88 (*)     All other components within normal limits  BLOOD GAS, VENOUS - Abnormal; Notable for the following:    pCO2, Ven 43.0 (*)     Acid-base deficit 5.4 (*)     All other components within normal limits  ETHANOL - Abnormal; Notable for the following:    Alcohol, Ethyl (B) 186 (*)     All other components within normal limits  SALICYLATE LEVEL - Abnormal; Notable for the following:    Salicylate Lvl <2.0 (*)     All other components within normal limits  LACTIC ACID, PLASMA - Abnormal; Notable for the following:    Lactic Acid, Venous 0.2 (*)     All other components within normal limits  LIPASE, BLOOD  ACETAMINOPHEN LEVEL  URINALYSIS, ROUTINE W REFLEX MICROSCOPIC  URINE RAPID DRUG SCREEN (HOSP PERFORMED)   Dg Abd Acute W/chest  01/08/2012  *RADIOLOGY REPORT*  Clinical Data: Vomiting, hematemesis  ACUTE ABDOMEN SERIES (ABDOMEN 2 VIEW & CHEST 1 VIEW)  Comparison: Prior chest x-ray and rib series 07/01/2006  Findings: The lungs are clear.  No focal airspace consolidation, pulmonary edema or pleural effusion.  Cardiac and mediastinal contours within normal limits.  No suspicious pulmonary nodular opacities.  No acute osseous abnormality.  No evidence of pneumothorax or free air on the decubitus abdominal views.  Unremarkable bowel gas pattern on the supine view.  Gas noted  throughout the colon to the  level of the rectum.  No evidence of obstruction.  No acute osseous abnormality.  IMPRESSION: 1.  No acute cardiopulmonary disease 2.  No acute abdominal abnormality by conventional radiography   Original Report Authenticated By: Malachy Moan, M.D.      No diagnosis found.   Date: 01/08/2012  Rate: 96  Rhythm: normal sinus rhythm  QRS Axis: normal  Intervals: normal  ST/T Wave abnormalities: nonspecific ST changes  Conduction Disutrbances:none  Narrative Interpretation:   Old EKG Reviewed: none available    MDM  Angel Mendez is a 47 y.o. female here with suicidal attempt with metformin, alcohol, and bleach. Will need to get tox, etoh level, labs, lactate. Nurse will call poison control. Will treat symptomatically.   9:03 PM Nurse called poison control, who recommend npo and EGD in AM. I called [facility], who will come and scope in AM. Patient to remain NPO after midnight. Tox showed alcohol 180, lactic acid nl. I discussed with Dr. Eben Burow, who accepted the patient on tele.         Richardean Canal, MD 01/08/12 2104

## 2012-01-08 NOTE — ED Notes (Signed)
Tonya at [employer] notified- Suggested ordering EKG, cardiac monitoring, Tylenol level, NPO x 2 hours, and possible EGD.

## 2012-01-08 NOTE — ED Notes (Signed)
EMS reports patient's brother called the police to report that his sister was trying to commit suicide.  Police did not have the address, so they had to search the entire neighborhood to find her.  Patient's brother reports h/o depression and SI and is not compliant with her medications.  EMS reports first call at 1645 this afternoon.  Patient was combative at scene and patient did not want to come to the hospital.  Patient reports taking 28 Metformin, ingested 1 cup of bleach, and also had "some" beer.  Patient reports cutting right wrist- no injuries noted.  Vitals stable and patient vomiting on arrival.

## 2012-01-08 NOTE — H&P (Signed)
History and Physical  Angel Mendez ZOX:[id number] DOB: [birth year] DOA: 01/08/2012  Referring physician: ER PCP: Lonia Blood, MD   Chief Complaint: Suicide attempt  HPI:  Patient is a 47 year old female with past medical history most significant for diabetes and severe depression was brought in by EMS after she attempted suicide on the evening of admission. Patient started drinking heavily one day prior to admission, she used to crack while drinking, attempted to cut her wrists and then moved to taking all of her metformin pills about 28. She swallowed the metformin pills with a bottle of chlorox. After doing the above things patient called her brother and told her about everything who ended up calling police.  Patient was brought in by EMS intoxicated. Tox screen has been positive for alcohol level of 180. No anion gaps or lactic acid has been noted on initial lab tests. Poison control has been notified. Patient will be getting an upper GI endoscopy to screen for the damage from chlorox. ER physician called me for admission observation.  Review of Systems:  Suicidal ideas and self injury Patient continues to feel that she is not worth living and does not want to be in the hospital. The patient continues to vomit. No chest pain, shortness of breath, abdominal pain, change in urinary habits , diarrhea, loss of consciousness noted.  Past Medical History  Diagnosis Date  . Seasonal allergies   . GERD (gastroesophageal reflux disease)     no current med.  . Depression     no current med.  . Diabetes mellitus     NIDDM  . Meniscus tear 04/2011    right knee    Past Surgical History  Procedure Date  . No past surgeries   . Knee arthroscopy 04/17/2011    Procedure: ARTHROSCOPY KNEE;  Surgeon: Sherri Rad, MD;  Location: [facility];  Service: Orthopedics;  Laterality: Right;  Removal of Plica and Medial Menical Debridement    Social History:  reports that  she has been smoking Cigarettes.  She has a 7.5 pack-year smoking history. She has never used smokeless tobacco. She reports that she drinks alcohol. She reports that she does not use illicit drugs.    Prior to Admission medications   Medication Sig Start Date End Date Taking? Authorizing Provider  ARIPiprazole (ABILIFY) 10 MG tablet Take 10 mg by mouth daily.    Historical Provider, MD  ibuprofen (ADVIL,MOTRIN) 200 MG tablet Take 200 mg by mouth every 6 (six) hours as needed. For pain    Historical Provider, MD  metFORMIN (GLUCOPHAGE) 850 MG tablet Take 850 mg by mouth every evening.     Historical Provider, MD  naproxen sodium (ANAPROX) 220 MG tablet Take 220 mg by mouth 2 (two) times daily as needed. For pain    Historical Provider, MD  pantoprazole (PROTONIX) 20 MG tablet Take 2 tablets (40 mg total) by mouth daily. 07/21/11 07/20/12  Fayrene Helper, PA-C  sertraline (ZOLOFT) 50 MG tablet Take 50 mg by mouth daily.    Historical Provider, MD  traZODone (DESYREL) 100 MG tablet Take 100 mg by mouth at bedtime.    Historical Provider, MD  vitamin C (ASCORBIC ACID) 500 MG tablet Take 1 tablet (500 mg total) by mouth daily. 04/17/11 04/16/12  Richardean Canal, PA   Physical Exam: Filed Vitals:   01/08/12 1909  BP: 116/76  Pulse: 99  Temp: 98.4 F (36.9 C)  TempSrc: Oral  Resp: 22  SpO2: 99%  BP 116/76  Pulse 99  Temp 98.4 F (36.9 C) (Oral)  Resp 22  SpO2 99%  LMP 12/09/2011  General Appearance:    Alert, cooperative, no distress, appears stated age  Head:    Normocephalic, without obvious abnormality, atraumatic  Eyes:    PERRL, conjunctiva looks red/corneas clear, EOM's intact, fundi benign, both eyes  Nose:   Nares normal, septum midline, mucosa normal, no drainage or sinus tenderness  Throat:   Lips, mucosa, and tongue normal; teeth and gums normal  Neck:   Supple, symmetrical, trachea midline, no adenopathy;    thyroid:  no enlargement/tenderness/nodules; no carotid   bruit or  JVD  Back:     Symmetric, no curvature, ROM normal, no CVA tenderness  Lungs:     Clear to auscultation bilaterally, respirations unlabored  Chest Wall:    No tenderness or deformity   Heart:    Regular rate and rhythm, S1 and S2 normal, no murmur, rub  or gallop  Abdomen:     Soft, non-tender, bowel sounds active all four quadrants,    no masses, no organomegaly  Extremities:   Extremities normal, atraumatic, no cyanosis or edema  Pulses:   2+ and symmetric all extremities  Skin:   Skin color, texture, turgor normal, no rashes or lesions  Lymph nodes:   Cervical, supraclavicular, and axillary nodes normal  Neurologic:   CNII-XII intact, normal strength, sensation and reflexes    throughout     Wt Readings from Last 3 Encounters:  04/12/11 236 lb (107.049 kg)  04/12/11 236 lb (107.049 kg)    Labs on Admission:  Basic Metabolic Panel:  Lab 01/08/12 4782  NA 138  K 4.0  CL 104  CO2 19  GLUCOSE 155*  BUN 4*  CREATININE 0.80  CALCIUM 9.4  MG --  PHOS --    Liver Function Tests:  Lab 01/08/12 1921  AST 61*  ALT 51*  ALKPHOS 96  BILITOT 0.3  PROT 7.9  ALBUMIN 3.8    Lab 01/08/12 1921  LIPASE 27  AMYLASE --    CBC:  Lab 01/08/12 1921  WBC 16.0*  NEUTROABS 7.5  HGB 16.1*  HCT 46.1*  MCV 92.9  PLT 249    CBG:  Lab 01/08/12 1901  GLUCAP 141*     Radiological Exams on Admission: Dg Abd Acute W/chest  01/08/2012  *RADIOLOGY REPORT*  Clinical Data: Vomiting, hematemesis  ACUTE ABDOMEN SERIES (ABDOMEN 2 VIEW & CHEST 1 VIEW)  Comparison: Prior chest x-ray and rib series 07/01/2006  Findings: The lungs are clear.  No focal airspace consolidation, pulmonary edema or pleural effusion.  Cardiac and mediastinal contours within normal limits.  No suspicious pulmonary nodular opacities.  No acute osseous abnormality.  No evidence of pneumothorax or free air on the decubitus abdominal views.  Unremarkable bowel gas pattern on the supine view.  Gas noted throughout the  colon to the level of the rectum.  No evidence of obstruction.  No acute osseous abnormality.  IMPRESSION: 1.  No acute cardiopulmonary disease 2.  No acute abdominal abnormality by conventional radiography   Original Report Authenticated By: Malachy Moan, M.D.     EKG: Independently reviewed. 96 beats per minute, normal axis, no ST or T wave changes noted   Principal Problem:  *Suicide attempt Active Problems:  Depression  Diabetes mellitus, type II   Assessment/Plan 1. Patient is currently severely depressed and continues to feel that life is not worth living. Gastroenterology I have been consulted  for management of possible gastric complications from [place]. Patient does not have any electrolyte abnormalities at this time. Patient does not have any EKG changes at this time. Poison control has already been notified.   -Continue to monitor basic metabolic profile every 12 hours -Restart home depression medications -Consult psychiatry once patient is medically stable for possible admission to behavioral health -Sitter in place for suicide precautions -IV fluids 125 cc an hour -N.p.o. for now for possible upper GI endoscopy tomorrow -Sliding scale insulin sensitive -Recheck lactic acid in a.m. -Frequent CBG monitoring(although hypoglycemia is less likely with metformin due to the mechanism of action) -Recheck 12-lead EKG in the a.m. as QTC prolongation can be seen -IV Protonix twice a day to minimize damage to endothelium    Code Status: Full Family Communication: None present at bedsode Disposition Plan/Anticipated LOS: observation  Time spent: 75 minutes  Lars Mage, MD  [facility] Team 5  If 7PM-7AM, please contact night-coverage at [email or web address], password [facility] 01/08/2012, 9:51 PM

## 2012-01-08 NOTE — ED Notes (Signed)
AVW:[id number]<XB> Expected date:01/08/12<BR> Expected time: 6:55 PM<BR> Means of arrival:Ambulance<BR> Comments:<BR> OD 28 Metformin, ETOH

## 2012-01-09 ENCOUNTER — Encounter (HOSPITAL_COMMUNITY): Payer: Self-pay

## 2012-01-09 ENCOUNTER — Encounter (HOSPITAL_COMMUNITY)
Admission: EM | Disposition: A | Discharge status: Other Acute Inpatient Hospital | Payer: Self-pay | Source: Home / Self Care | Attending: Internal Medicine

## 2012-01-09 DIAGNOSIS — T5491XA Toxic effect of unspecified corrosive substance, accidental (unintentional), initial encounter: Secondary | ICD-10-CM

## 2012-01-09 DIAGNOSIS — E119 Type 2 diabetes mellitus without complications: Secondary | ICD-10-CM

## 2012-01-09 DIAGNOSIS — T50901A Poisoning by unspecified drugs, medicaments and biological substances, accidental (unintentional), initial encounter: Secondary | ICD-10-CM

## 2012-01-09 HISTORY — PX: ESOPHAGOGASTRODUODENOSCOPY: SHX5428

## 2012-01-09 LAB — URINE MICROSCOPIC-ADD ON

## 2012-01-09 LAB — BASIC METABOLIC PANEL
BUN: 6 mg/dL (ref 6–23)
BUN: 13 mg/dL (ref 6–23)
BUN: 9 mg/dL (ref 6–23)
CO2: 23 mEq/L (ref 19–32)
CO2: 19 mEq/L (ref 19–32)
CO2: 22 mEq/L (ref 19–32)
Calcium: 9.2 mg/dL (ref 8.4–10.5)
Calcium: 8.3 mg/dL — ABNORMAL LOW (ref 8.4–10.5)
Calcium: 8.1 mg/dL — ABNORMAL LOW (ref 8.4–10.5)
Chloride: 104 mEq/L (ref 96–112)
Chloride: 104 mEq/L (ref 96–112)
Chloride: 102 mEq/L (ref 96–112)
Creatinine, Ser: 1.32 mg/dL — ABNORMAL HIGH (ref 0.50–1.10)
Creatinine, Ser: 1.4 mg/dL — ABNORMAL HIGH (ref 0.50–1.10)
Creatinine, Ser: 0.94 mg/dL (ref 0.50–1.10)
GFR calc Af Amer: 56 mL/min — ABNORMAL LOW (ref >90)
GFR calc Af Amer: 52 mL/min — ABNORMAL LOW (ref >90)
GFR calc Af Amer: 84 mL/min — ABNORMAL LOW (ref >90)
GFR calc non Af Amer: 48 mL/min — ABNORMAL LOW (ref >90)
GFR calc non Af Amer: 45 mL/min — ABNORMAL LOW (ref >90)
GFR calc non Af Amer: 73 mL/min — ABNORMAL LOW (ref >90)
Glucose, Bld: 103 mg/dL — ABNORMAL HIGH (ref 70–99)
Glucose, Bld: 122 mg/dL — ABNORMAL HIGH (ref 70–99)
Glucose, Bld: 103 mg/dL — ABNORMAL HIGH (ref 70–99)
Potassium: 4.7 mEq/L (ref 3.5–5.1)
Potassium: 4.4 mEq/L (ref 3.5–5.1)
Potassium: 4 mEq/L (ref 3.5–5.1)
Sodium: 139 mEq/L (ref 135–145)
Sodium: 135 mEq/L (ref 135–145)
Sodium: 133 mEq/L — ABNORMAL LOW (ref 135–145)

## 2012-01-09 LAB — URINALYSIS, ROUTINE W REFLEX MICROSCOPIC
Glucose, UA: 100 mg/dL — AB
Hgb urine dipstick: NEGATIVE
Nitrite: NEGATIVE
Protein, ur: 100 mg/dL — AB
Specific Gravity, Urine: 1.019 (ref 1.005–1.030)
Urobilinogen, UA: 1 mg/dL (ref 0.0–1.0)
pH: 5.5 (ref 5.0–8.0)

## 2012-01-09 LAB — RAPID URINE DRUG SCREEN, HOSP PERFORMED
Amphetamines: NOT DETECTED
Barbiturates: NOT DETECTED
Benzodiazepines: NOT DETECTED
Cocaine: POSITIVE — AB
Opiates: NOT DETECTED
Tetrahydrocannabinol: NOT DETECTED

## 2012-01-09 LAB — GLUCOSE, CAPILLARY
Glucose-Capillary: 106 mg/dL — ABNORMAL HIGH (ref 70–99)
Glucose-Capillary: 85 mg/dL (ref 70–99)
Glucose-Capillary: 84 mg/dL (ref 70–99)
Glucose-Capillary: 76 mg/dL (ref 70–99)
Glucose-Capillary: 105 mg/dL — ABNORMAL HIGH (ref 70–99)
Glucose-Capillary: 97 mg/dL (ref 70–99)

## 2012-01-09 LAB — HEMOGLOBIN A1C
Hgb A1c MFr Bld: 6.2 % — ABNORMAL HIGH (ref <5.7)
Mean Plasma Glucose: 131 mg/dL — ABNORMAL HIGH (ref <117)

## 2012-01-09 LAB — LACTIC ACID, PLASMA: Lactic Acid, Venous: 5.2 mmol/L — ABNORMAL HIGH (ref 0.5–2.2)

## 2012-01-09 SURGERY — EGD (ESOPHAGOGASTRODUODENOSCOPY)
Anesthesia: Moderate Sedation

## 2012-01-09 MED ORDER — NICOTINE 7 MG/24HR TD PT24
7.0000 mg | MEDICATED_PATCH | Freq: Every day | TRANSDERMAL | Status: DC
  Administered 2012-01-09 – 2012-01-11 (×4): 7 mg via TRANSDERMAL
  Filled 2012-01-09 (×4): qty 1

## 2012-01-09 MED ORDER — FENTANYL CITRATE 0.05 MG/ML IJ SOLN
INTRAMUSCULAR | Status: DC | PRN
  Administered 2012-01-09 (×2): 25 ug via INTRAVENOUS

## 2012-01-09 MED ORDER — MIDAZOLAM HCL 10 MG/2ML IJ SOLN
INTRAMUSCULAR | Status: AC
  Filled 2012-01-09: qty 4

## 2012-01-09 MED ORDER — FENTANYL CITRATE 0.05 MG/ML IJ SOLN
INTRAMUSCULAR | Status: AC
  Filled 2012-01-09: qty 4

## 2012-01-09 MED ORDER — MIDAZOLAM HCL 10 MG/2ML IJ SOLN
INTRAMUSCULAR | Status: DC | PRN
  Administered 2012-01-09 (×2): 2 mg via INTRAVENOUS

## 2012-01-09 MED ORDER — DIPHENHYDRAMINE HCL 50 MG/ML IJ SOLN
INTRAMUSCULAR | Status: AC
  Filled 2012-01-09: qty 1

## 2012-01-09 MED ORDER — BUTAMBEN-TETRACAINE-BENZOCAINE 2-2-14 % EX AERO
INHALATION_SPRAY | CUTANEOUS | Status: DC | PRN
  Administered 2012-01-09: 1 via TOPICAL

## 2012-01-09 NOTE — Op Note (Signed)
[facility] [address] [place], [address]   ENDOSCOPY PROCEDURE REPORT  PATIENT: Angel Mendez, Angel Mendez  MR#: [id number] BIRTHDATE: 1965-10-06 , 46  yrs. old GENDER: Female ENDOSCOPIST:Eulene Pekar Madilyn Fireman, MD REFERRED BY: PROCEDURE DATE:  01/09/2012 PROCEDURE: ASA CLASS: INDICATIONS:   ingestion of bleach in a suicide attempt MEDICATION:    50 mcg fentanyl 4 mg Versed TOPICAL ANESTHETIC:   Cetacaine  DESCRIPTION OF PROCEDURE:   esophagus: Minimal pharyngeal irritation around the valleculae. Proximal third of esophagus appeared normal. In the distal two thirds there was transition to mild granularity with whitish exudate slight furrowing, edema and granularity. There were no focal erosions ulcers or visible signs of impending necrosis or sloughing of tissue.  Stomach: Diffuse punctate erythema and granularity without any focal ulcer or erosion, presumably from ingestion of bleach  Duodenum: Minimal bulbar erythema and granularity without ulcer or other mucosal injury     COMPLICATIONS: None  ENDOSCOPIC IMPRESSION:mild distal esophageal inflammation consistent with ingestion of high pH solution, no suggestion of impending non-viability of mucosa or submucosal injury. Mild to moderate gastritis presumably related to same ingestion.  RECOMMENDATIONS:supportive care and PPI therapy. Consider repeat EGD in 2 months to look for distal esophageal fibrosis or stricturing particularly if symptomatic.    _______________________________ Rosalie DoctorDorena Cookey, MD 01/09/2012 11:12 AM

## 2012-01-09 NOTE — Brief Op Note (Signed)
01/08/2012 - 01/09/2012  11:03 AM  PATIENT:  Angel Mendez  47 y.o. female  PRE-OPERATIVE DIAGNOSIS:  caustic ingestion (bleech)  POST-OPERATIVE DIAGNOSIS:  gastritis /esophagitis  PROCEDURE:  Procedure(s) (LRB) with comments: ESOPHAGOGASTRODUODENOSCOPY (EGD) (N/A)  SURGEON:  Surgeon(s) and Role:    * Barrie Folk, MD - Primary  EGD:  Esophagus: Minimal proximal pharyngeal nonspecific inflammation. Proximal third of the esophagus appeared normal. Distal two thirds showed increasing signs of inflammation with whitish exudate and granularity but no friability. No deep ulcers stricture pill fragments or any foreign bodies were seen. No evidence of impending necrosis.The squamocolumnar lign was sharply outlined at 38 cm.  Stomach diffuse punctate erythema and granularity throughout the stomach presumably secondary to ingestion of bleach. No focal ulcers or erosions. Duodenum minimal duodenitis no ulcers  We'll probably need repeat EGD in 2 months to assess for stricture formation.

## 2012-01-09 NOTE — Progress Notes (Signed)
Patient resting but has complaints of pins and needles in toes and feet, will continue to monitor

## 2012-01-09 NOTE — Progress Notes (Signed)
[facility] PROGRESS NOTE  CERISE LIEBER AOZ:[id number] DOB: 09/23/1965 DOA: 01/08/2012 PCP: Lonia Blood, MD  Brief narrative: 47 year old female with past medical history of depression and diabetes who is admitted for suicidal attempt. Patient has swallowed all of her metformin pills and had drank bleach. Patient is scheduled for endoscopy this morning. Sitter at bedside. Psychiatry consulted.  Assessment/Plan:  Principal Problem:  *Suicide attempt  Need psych evaluation and potential discharge to [facility] once medically stable  Active Problems:  Depression  Continue abilify and sertraline; this may be adjusted by psychiatry  Diabetes mellitus, type II  NPO; metformin held   Code Status: full code Family Communication: no family at bedside Disposition Plan: needs psych consult prior to discharge plan  Manson Passey, MD  [facility] Pager [phone]  If 7PM-7AM, please contact night-coverage [email or web address] Password [email or web address] 01/09/2012, 11:19 AM   LOS: 1 day   Consultants:  GI  Procedures:  EGD 01/09/2012  Antibiotics:  None   HPI/Subjective: No acute overnight events.  Objective: Filed Vitals:   01/09/12 1055 01/09/12 1100 01/09/12 1105 01/09/12 1112  BP: 109/67 95/60 129/67   Pulse: 85 104 84   Temp:    98.3 F (36.8 C)  TempSrc:      Resp: 19 18 18    Height:      Weight:      SpO2: 95% 97% 96%     Intake/Output Summary (Last 24 hours) at 01/09/12 1119 Last data filed at 01/09/12 0630  Gross per 24 hour  Intake      0 ml  Output    300 ml  Net   -300 ml    Exam:   General:  Pt is alert, follows commands appropriately, not in acute distress  Cardiovascular: Regular rate and rhythm, S1/S2, no murmurs, no rubs, no gallops  Respiratory: Clear to auscultation bilaterally, no wheezing, no crackles, no rhonchi  Abdomen: Soft, non tender, non distended, bowel sounds present, no guarding  Extremities: No edema, pulses DP and PT palpable  bilaterally  Neuro: Grossly nonfocal  Data Reviewed: Basic Metabolic Panel:  Lab 01/08/12 4132 01/08/12 1921  NA 139 138  K 4.7 4.0  CL 104 104  CO2 23 19  GLUCOSE 103* 155*  BUN 6 4*  CREATININE 1.32* 0.80  CALCIUM 9.2 9.4  MG -- --  PHOS -- --   Liver Function Tests:  Lab 01/08/12 1921  AST 61*  ALT 51*  ALKPHOS 96  BILITOT 0.3  PROT 7.9  ALBUMIN 3.8    Lab 01/08/12 1921  LIPASE 27  AMYLASE --   No results found for this basename: AMMONIA:5 in the last 168 hours CBC:  Lab 01/08/12 1921  WBC 16.0*  NEUTROABS 7.5  HGB 16.1*  HCT 46.1*  MCV 92.9  PLT 249   Cardiac Enzymes: No results found for this basename: CKTOTAL:5,CKMB:5,CKMBINDEX:5,TROPONINI:5 in the last 168 hours BNP: No components found with this basename: POCBNP:5 CBG:  Lab 01/09/12 0732 01/09/12 0405 01/09/12 0007 01/08/12 1901  GLUCAP 84 85 106* 141*    No results found for this or any previous visit (from the past 240 hour(s)).   Studies: Dg Abd Acute W/chest 01/08/2012   IMPRESSION: 1.  No acute cardiopulmonary disease 2.  No acute abdominal abnormality by conventional radiography   Original Report Authenticated By: Malachy Moan, M.D.     Scheduled Meds:   . ARIPiprazole  10 mg Oral Daily  . enoxaparin (LOVENOX)   40 mg Subcutaneous QHS  .  fentaNYL  50 mcg Intravenous Once  . insulin aspart  0-9 Units Subcutaneous Q4H  . nicotine  7 mg Transdermal Daily  . pantoprazole  40 mg Intravenous Q12H  . sertraline  50 mg Oral Daily  . sodium chloride  3 mL Intravenous Q12H  . traZODone  100 mg Oral QHS   Continuous Infusions:   . sodium chloride 125 mL/hr at 01/08/12 2227

## 2012-01-09 NOTE — Consult Note (Signed)
Subjective:   HPI  The patient is a 47 year old female who last evening in an attempt to commit suicide swallowed all of her metformin pills with a glass and a half of Clorox bleach. She came to the emergency room where she was subsequently admitted. She currently feels a little burning sensation in her throat and chest but not all that bad according to her. She is not experiencing dysphagia. She is not vomiting any blood.  Review of Systems Negative except for above  Past Medical History  Diagnosis Date  . Seasonal allergies   . Diabetes mellitus     NIDDM  . Meniscus tear 04/2011    right knee  . Depression     currently on meds  . GERD (gastroesophageal reflux disease)     treats w/OTC meds   Past Surgical History  Procedure Date  . No past surgeries   . Knee arthroscopy 04/17/2011    Procedure: ARTHROSCOPY KNEE;  Surgeon: Sherri Rad, MD;  Location: [facility];  Service: Orthopedics;  Laterality: Right;  Removal of Plica and Medial Menical Debridement   History   Social History  . Marital Status: Single    Spouse Name: N/A    Number of Children: N/A  . Years of Education: N/A   Occupational History  . Not on file.   Social History Main Topics  . Smoking status: Current Every Day Smoker -- 1.0 packs/day for 15 years    Types: Cigarettes  . Smokeless tobacco: Never Used     Comment: 5 cig./day  . Alcohol Use: Yes     Comment: occasionally  . Drug Use: No  . Sexually Active:    Other Topics Concern  . Not on file   Social History Narrative  . No narrative on file   family history is not on file. Current facility-administered medications:0.9 %  sodium chloride infusion, , Intravenous, Continuous, Lars Mage, MD, Last Rate: 125 mL/hr at 01/08/12 2227;  ARIPiprazole (ABILIFY) tablet 10 mg, 10 mg, Oral, Daily, Lars Mage, MD;  enoxaparin (LOVENOX) injection 40 mg, 40 mg, Subcutaneous, QHS, Lars Mage, MD, 40 mg at 01/08/12 2304;  fentaNYL (SUBLIMAZE)  injection 50 mcg, 50 mcg, Intravenous, Once, Richardean Canal, MD insulin aspart (novoLOG) injection 0-9 Units, 0-9 Units, Subcutaneous, Q4H, Lars Mage, MD;  morphine 2 MG/ML injection 2 mg, 2 mg, Intravenous, Q2H PRN, Lars Mage, MD;  nicotine (NICODERM CQ - dosed in mg/24 hr) patch 7 mg, 7 mg, Transdermal, Daily, Leda Gauze, NP, 7 mg at 01/09/12 0123;  ondansetron (ZOFRAN) injection 4 mg, 4 mg, Intravenous, Q6H PRN, Lars Mage, MD, 4 mg at 01/09/12 0029 ondansetron (ZOFRAN) tablet 4 mg, 4 mg, Oral, Q6H PRN, Lars Mage, MD;  pantoprazole (PROTONIX) injection 40 mg, 40 mg, Intravenous, Q12H, Lars Mage, MD, 40 mg at 01/08/12 2238;  sertraline (ZOLOFT) tablet 50 mg, 50 mg, Oral, Daily, Lars Mage, MD;  sodium chloride 0.9 % injection 3 mL, 3 mL, Intravenous, Q12H, Lars Mage, MD;  traZODone (DESYREL) tablet 100 mg, 100 mg, Oral, QHS, Lars Mage, MD, 100 mg at 01/08/12 2304 No Known Allergies   Objective:     BP 101/66  Pulse 89  Temp 98.2 F (36.8 C) (Oral)  Resp 16  Ht 5\' 7"  (1.702 m)  Wt 94.348 kg (208 lb)  BMI 32.58 kg/m2  SpO2 95%  LMP 12/09/2011  Alert and in no acute distress  Heart regular rhythm  Lungs clear  Abdomen: Bowel sounds normal,  soft, minimal epigastric tenderness  Laboratory No components found with this basename: d1      Assessment:     Caustic ingestion of bleach      Plan:     EGD today to evaluate damage to the upper GI tract

## 2012-01-09 NOTE — Progress Notes (Signed)
Poison Control called several times today to check on pt status and inquire about lab work.

## 2012-01-09 NOTE — Progress Notes (Signed)
Patient is under IVC status. Psych MD to see patient. CSW to follow for probably inpatient hospitalization.  Niana Martorana C. Ileah Falkenstein MSW, LCSW [phone]

## 2012-01-09 NOTE — Progress Notes (Signed)
Pt requests Brother-Raymond Azucena Kuba to be contacted in case of Emergency, not Spouse Bakkin Hantchi.

## 2012-01-10 ENCOUNTER — Inpatient Hospital Stay (HOSPITAL_COMMUNITY): Payer: Medicaid Other

## 2012-01-10 ENCOUNTER — Telehealth (HOSPITAL_COMMUNITY): Payer: Self-pay | Admitting: *Deleted

## 2012-01-10 ENCOUNTER — Encounter (HOSPITAL_COMMUNITY): Payer: Self-pay | Admitting: Gastroenterology

## 2012-01-10 DIAGNOSIS — F1020 Alcohol dependence, uncomplicated: Secondary | ICD-10-CM

## 2012-01-10 DIAGNOSIS — F3289 Other specified depressive episodes: Secondary | ICD-10-CM

## 2012-01-10 DIAGNOSIS — X838XXA Intentional self-harm by other specified means, initial encounter: Secondary | ICD-10-CM

## 2012-01-10 DIAGNOSIS — F329 Major depressive disorder, single episode, unspecified: Secondary | ICD-10-CM

## 2012-01-10 DIAGNOSIS — F4310 Post-traumatic stress disorder, unspecified: Secondary | ICD-10-CM

## 2012-01-10 LAB — BASIC METABOLIC PANEL
BUN: 6 mg/dL (ref 6–23)
BUN: 5 mg/dL — ABNORMAL LOW (ref 6–23)
CO2: 22 mEq/L (ref 19–32)
CO2: 25 mEq/L (ref 19–32)
Calcium: 7.9 mg/dL — ABNORMAL LOW (ref 8.4–10.5)
Calcium: 8.2 mg/dL — ABNORMAL LOW (ref 8.4–10.5)
Chloride: 107 mEq/L (ref 96–112)
Chloride: 105 mEq/L (ref 96–112)
Creatinine, Ser: 0.81 mg/dL (ref 0.50–1.10)
Creatinine, Ser: 0.84 mg/dL (ref 0.50–1.10)
GFR calc Af Amer: >90 mL/min (ref >90)
GFR calc Af Amer: >90 mL/min (ref >90)
GFR calc non Af Amer: 87 mL/min — ABNORMAL LOW (ref >90)
GFR calc non Af Amer: 83 mL/min — ABNORMAL LOW (ref >90)
Glucose, Bld: 97 mg/dL (ref 70–99)
Glucose, Bld: 113 mg/dL — ABNORMAL HIGH (ref 70–99)
Potassium: 3.9 mEq/L (ref 3.5–5.1)
Potassium: 4.2 mEq/L (ref 3.5–5.1)
Sodium: 134 mEq/L — ABNORMAL LOW (ref 135–145)
Sodium: 136 mEq/L (ref 135–145)

## 2012-01-10 LAB — URINE CULTURE: Colony Count: 30000

## 2012-01-10 LAB — GLUCOSE, CAPILLARY
Glucose-Capillary: 104 mg/dL — ABNORMAL HIGH (ref 70–99)
Glucose-Capillary: 109 mg/dL — ABNORMAL HIGH (ref 70–99)
Glucose-Capillary: 112 mg/dL — ABNORMAL HIGH (ref 70–99)
Glucose-Capillary: 116 mg/dL — ABNORMAL HIGH (ref 70–99)
Glucose-Capillary: 134 mg/dL — ABNORMAL HIGH (ref 70–99)
Glucose-Capillary: 91 mg/dL (ref 70–99)

## 2012-01-10 LAB — TROPONIN I: Troponin I: <0.3 ng/mL (ref <0.30)

## 2012-01-10 MED ORDER — PANTOPRAZOLE SODIUM 40 MG PO TBEC: 40.0000 mg | DELAYED_RELEASE_TABLET | Freq: Two times a day (BID) | ORAL | Status: DC

## 2012-01-10 MED ORDER — PANTOPRAZOLE SODIUM 40 MG PO TBEC
40.0000 mg | DELAYED_RELEASE_TABLET | Freq: Two times a day (BID) | ORAL | Status: DC
  Administered 2012-01-10 – 2012-01-11 (×2): 40 mg via ORAL
  Filled 2012-01-10 (×3): qty 1
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
## 2012-01-10 NOTE — Progress Notes (Signed)
CSW was informed that patient is complaining of chest pain and pt dc will be postponed until tomorrow. CSW spoke with [facility] who agreed. CSW will informed weekend CSW of patient needs regarding rescinding IVC. Pt wishes to voluntarily transfer to [facility] when medically stable. Per discussion with attending physician, physician does not wish to send patient under ivc unless necessary.   Catha Gosselin, LCSWA  [phone] .01/10/2012 10:12pm

## 2012-01-10 NOTE — Progress Notes (Signed)
CSW received call from pt RN regarding pt transfer to [facility]. CSW explained to MD that medical examination and recommendation had to be completed to either continue or rescind ivc. CSW confirmed that with [facility] and left message for MD to call back regarding pt IVC and discharge.   Catha Gosselin, LCSWA  [phone] .01/10/2012 2156pm

## 2012-01-10 NOTE — Progress Notes (Signed)
[facility] to review for admission.  Harkirat Orozco C. Merly Hinkson MSW, LCSW [phone]

## 2012-01-10 NOTE — Progress Notes (Signed)
Progress Notes Patient Identification:  Angel Mendez Date of Evaluation:  01/10/2012 Reason for Consult: Suicide attempt   Referring Provider: Dr. Elisabeth Pigeon  Responded to request to evaluate pt for drinking bleach.  Pt is in bed .  Sitter leaves.  Pt is sleeping.  She wakens to answer one question.  She is sedated.  Charge RN is notified.    Pt had endoscopy today to determine degree of damage.  Superficial irritation was reported.   Will follow pt when awake.  Mickeal Skinner MD 01/10/2012 12:39 AM

## 2012-01-10 NOTE — Progress Notes (Signed)
Patient accepted by [name], NP to Dr. Daleen Bo.  Patient's RN notified of acceptance. Rosey Bath, RN

## 2012-01-10 NOTE — Progress Notes (Signed)
CSW spoke with attending MD who is attempting to have colleague MD assist with ivc paperwork. Pt is unable to transfer to [facility] until ivc is either continued with medication and recommendation paperwork or ivc is rescinded.   CSW informed patient RN.   Marland KitchenCatha Gosselin, LCSWA  [phone] .01/10/2012 2046pm

## 2012-01-10 NOTE — Discharge Summary (Signed)
Physician Discharge Summary  Angel Mendez VWU:[id number] DOB: 1965/03/10 DOA: 01/08/2012  PCP: Lonia Blood, MD  Admit date: 01/08/2012 Discharge date: 01/10/2012  Discharge Diagnoses:  Principal Problem:  *Suicide attempt Active Problems:  Depression  Diabetes mellitus, type II  Discharge Condition: Medically stable and clinically appears well for discharge to behavioral health today.  Diet recommendation: Diabetic diet  History of present illness:  47 year old female with past medical history of depression and diabetes who is admitted for suicidal attempt. Patient has swallowed all of her metformin pills and had drank bleach. Patient had endoscopy done 01/09/2011 with findings of gastritis. She will continue taking Protonix.    Assessment/Plan:   Principal Problem:  *Suicide attempt  Appreciate psychiatric consultation Active Problems:  Depression  Continue abilify and sertraline; this may be adjusted by psychiatry Diabetes mellitus, type II  MAY CONTINUE METFORMIN ON DISCHARGE   Code Status: full code  Family Communication: no family at bedside  Disposition Plan: To behavioral health   Angel Passey, MD  [facility]  Pager [phone]   Consultants:  GI Psych Procedures:  EGD 01/09/2012 Antibiotics:  None   Discharge Exam: Filed Vitals:   01/10/12 0618  BP: 114/63  Pulse: 76  Temp: 98.2 F (36.8 C)  Resp: 16   Filed Vitals:   01/09/12 1223 01/09/12 1348 01/09/12 2125 01/10/12 0618  BP: 117/76 122/75 109/69 114/63  Pulse:   73 76  Temp: 97.7 F (36.5 C) 98.4 F (36.9 C) 98.2 F (36.8 C) 98.2 F (36.8 C)  TempSrc: Oral Oral Oral Oral  Resp: 18 18 18 16   Height:      Weight:      SpO2: 97% 96% 97% 95%    General: Pt is alert, follows commands appropriately, not in acute distress Cardiovascular: Regular rate and rhythm, S1/S2 +, no murmurs, no rubs, no gallops Respiratory: Clear to auscultation bilaterally, no wheezing, no crackles, no  rhonchi Abdominal: Soft, non tender, non distended, bowel sounds +, no guarding Extremities: no edema, no cyanosis, pulses palpable bilaterally DP and PT Neuro: Grossly nonfocal  Discharge Instructions     Medication List     As of 01/10/2012  7:13 AM    TAKE these medications         ARIPiprazole 10 MG tablet   Commonly known as: ABILIFY   Take 10 mg by mouth daily.      ibuprofen 200 MG tablet   Commonly known as: ADVIL,MOTRIN   Take 200 mg by mouth every 6 (six) hours as needed. For pain      metFORMIN 850 MG tablet   Commonly known as: GLUCOPHAGE   Take 850 mg by mouth every evening.      naproxen sodium 220 MG tablet   Commonly known as: ANAPROX   Take 220 mg by mouth 2 (two) times daily as needed. For pain      pantoprazole 20 MG tablet   Commonly known as: PROTONIX   Take 2 tablets (40 mg total) by mouth daily.      sertraline 50 MG tablet   Commonly known as: ZOLOFT   Take 50 mg by mouth daily.      traZODone 100 MG tablet   Commonly known as: DESYREL   Take 100 mg by mouth at bedtime.      vitamin C 500 MG tablet   Commonly known as: ASCORBIC ACID   Take 1 tablet (500 mg total) by mouth daily.  Follow-up Information    Follow up with [facility], MD. In 2 weeks.   Contact information:   [address] [place] [address] [phone]           The results of significant diagnostics from this hospitalization (including imaging, microbiology, ancillary and laboratory) are listed below for reference.    Significant Diagnostic Studies: Dg Abd Acute W/chest 01/08/2012   IMPRESSION: 1.  No acute cardiopulmonary disease 2.  No acute abdominal abnormality by conventional radiography    Microbiology: No results found for this or any previous visit (from the past 240 hour(s)).   Labs: Basic Metabolic Panel:  Lab 01/10/12 4696 01/09/12 2245 01/09/12 1255 01/08/12 2316 01/08/12 1921  NA 134* 133* 135 139 138  K 3.9 4.0 4.4 4.7 4.0  CL  107 102 104 104 104  CO2 22 22 19 23 19   GLUCOSE 97 103* 122* 103* 155*  BUN 6 9 13 6  4*  CREATININE 0.81 0.94 1.40* 1.32* 0.80  CALCIUM 7.9* 8.1* 8.3* 9.2 9.4  MG -- -- -- -- --  PHOS -- -- -- -- --   Liver Function Tests:  Lab 01/08/12 1921  AST 61*  ALT 51*  ALKPHOS 96  BILITOT 0.3  PROT 7.9  ALBUMIN 3.8    Lab 01/08/12 1921  LIPASE 27  AMYLASE --   CBC:  Lab 01/08/12 1921  WBC 16.0*  NEUTROABS 7.5  HGB 16.1*  HCT 46.1*  MCV 92.9  PLT 249   CBG:  Lab 01/10/12 0419 01/10/12 0020 01/09/12 1958 01/09/12 1601 01/09/12 1145  GLUCAP 91 109* 97 105* 76    Time coordinating discharge: Over 30 minutes  Signed:  Manson Passey, MD  [facility] 01/10/2012, 7:13 AM  Pager #: [phone]

## 2012-01-10 NOTE — Progress Notes (Signed)
This patient is receiving IV Protonix. Based on criteria approved by the Pharmacy and Therapeutics Committee, this medication is being converted to the equivalent oral dose form. These criteria include:   . The patient is eating (either orally or per tube) and/or has been taking other orally administered medications for at least 24 hours.  . This patient has no evidence of active gastrointestinal bleeding or impaired GI absorption (gastrectomy, short bowel, patient on TNA or NPO).   If you have questions about this conversion, please contact the pharmacy department.  Dannielle Huh, [facility] 01/10/2012 12:07 PM
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
## 2012-01-10 NOTE — Progress Notes (Addendum)
[facility] Progress Note  Subjective: Patient denies any oropharyngeal pain, odynophagia dysphagia or chest or abdominal pain. Objective: Vital signs in last 24 hours: Temp:  [97.7 F (36.5 C)-98.4 F (36.9 C)] 98.2 F (36.8 C) (01/03 0618) Pulse Rate:  [73-76] 76  (01/03 0618) Resp:  [16-18] 16  (01/03 0618) BP: (105-122)/(44-76) 114/63 mmHg (01/03 0618) SpO2:  [95 %-97 %] 95 % (01/03 0618) Weight change:    PE: Unchanged. Affect a little brighter.  Lab Results: Results for orders placed during the hospital encounter of 01/08/12 (from the past 24 hour(s))  GLUCOSE, CAPILLARY     Status: Normal   Collection Time   01/09/12 11:45 AM      Component Value Range   Glucose-Capillary 76  70 - 99 mg/dL  BASIC METABOLIC PANEL     Status: Abnormal   Collection Time   01/09/12 12:55 PM      Component Value Range   Sodium 135  135 - 145 mEq/L   Potassium 4.4  3.5 - 5.1 mEq/L   Chloride 104  96 - 112 mEq/L   CO2 19  19 - 32 mEq/L   Glucose, Bld 122 (*) 70 - 99 mg/dL   BUN 13  6 - 23 mg/dL   Creatinine, Ser 9.14 (*) 0.50 - 1.10 mg/dL   Calcium 8.3 (*) 8.4 - 10.5 mg/dL   GFR calc non Af Amer 45 (*) >90 mL/min   GFR calc Af Amer 52 (*) >90 mL/min  GLUCOSE, CAPILLARY     Status: Abnormal   Collection Time   01/09/12  4:01 PM      Component Value Range   Glucose-Capillary 105 (*) 70 - 99 mg/dL  GLUCOSE, CAPILLARY     Status: Normal   Collection Time   01/09/12  7:58 PM      Component Value Range   Glucose-Capillary 97  70 - 99 mg/dL   Comment 1 Documented in Chart     Comment 2 Notify RN    BASIC METABOLIC PANEL     Status: Abnormal   Collection Time   01/09/12 10:45 PM      Component Value Range   Sodium 133 (*) 135 - 145 mEq/L   Potassium 4.0  3.5 - 5.1 mEq/L   Chloride 102  96 - 112 mEq/L   CO2 22  19 - 32 mEq/L   Glucose, Bld 103 (*) 70 - 99 mg/dL   BUN 9  6 - 23 mg/dL   Creatinine, Ser 7.82  0.50 - 1.10 mg/dL   Calcium 8.1 (*) 8.4 - 10.5 mg/dL   GFR calc non Af  Amer 73 (*) >90 mL/min   GFR calc Af Amer 84 (*) >90 mL/min  GLUCOSE, CAPILLARY     Status: Abnormal   Collection Time   01/10/12 12:20 AM      Component Value Range   Glucose-Capillary 109 (*) 70 - 99 mg/dL   Comment 1 Notify RN    GLUCOSE, CAPILLARY     Status: Normal   Collection Time   01/10/12  4:19 AM      Component Value Range   Glucose-Capillary 91  70 - 99 mg/dL   Comment 1 Documented in Chart     Comment 2 Notify RN    BASIC METABOLIC PANEL     Status: Abnormal   Collection Time   01/10/12  5:15 AM      Component Value Range   Sodium 134 (*) 135 - 145  mEq/L   Potassium 3.9  3.5 - 5.1 mEq/L   Chloride 107  96 - 112 mEq/L   CO2 22  19 - 32 mEq/L   Glucose, Bld 97  70 - 99 mg/dL   BUN 6  6 - 23 mg/dL   Creatinine, Ser 5.78  0.50 - 1.10 mg/dL   Calcium 7.9 (*) 8.4 - 10.5 mg/dL   GFR calc non Af Amer 87 (*) >90 mL/min   GFR calc Af Amer >90  >90 mL/min  GLUCOSE, CAPILLARY     Status: Abnormal   Collection Time   01/10/12  7:47 AM      Component Value Range   Glucose-Capillary 104 (*) 70 - 99 mg/dL    Studies/Results: Dg Abd Acute W/chest  01/08/2012  *RADIOLOGY REPORT*  Clinical Data: Vomiting, hematemesis  ACUTE ABDOMEN SERIES (ABDOMEN 2 VIEW & CHEST 1 VIEW)  Comparison: Prior chest x-ray and rib series 07/01/2006  Findings: The lungs are clear.  No focal airspace consolidation, pulmonary edema or pleural effusion.  Cardiac and mediastinal contours within normal limits.  No suspicious pulmonary nodular opacities.  No acute osseous abnormality.  No evidence of pneumothorax or free air on the decubitus abdominal views.  Unremarkable bowel gas pattern on the supine view.  Gas noted throughout the colon to the level of the rectum.  No evidence of obstruction.  No acute osseous abnormality.  IMPRESSION: 1.  No acute cardiopulmonary disease 2.  No acute abdominal abnormality by conventional radiography   Original Report Authenticated By: Malachy Moan, M.D.        Assessment: Alkali ingestion, initial esophageal and gastric mucosal changes fairly mild, lack of symptoms suggest she will not progressed to necrosis and hopefully not to eventual strictures and fibrosis but this certainly can happen.  Plan: Will sign off for now. Will advance diet. Call us if future GI input needed.  Lavetta Geier C 01/10/2012, 11:22 AM

## 2012-01-10 NOTE — Progress Notes (Signed)
Patient complaining of chest pain. Worse with deep breaths and cough. States has been continues for the past 1.5 days. Patient states it feels like pulled muscle.  She has worsening cough productive of dark sputum. She has risk factors of tobacco abuse and DM2 but pain is very atypical likely non-cardiac. Will obtain CXR given hx of bleach ingestion to make sure she is not developing pneumonitis or aspiration PNA. Will check troponin.  Further work up based on results.   Raymund Manrique 10:19 PM

## 2012-01-11 ENCOUNTER — Inpatient Hospital Stay (HOSPITAL_COMMUNITY)
Admission: RE | Admit: 2012-01-11 | Discharge: 2012-01-16 | DRG: 881 | Disposition: A | Discharge status: Home / Self Care | Payer: Medicaid Other | Source: Ambulatory Visit | Attending: Psychiatry | Admitting: Psychiatry

## 2012-01-11 ENCOUNTER — Encounter (HOSPITAL_COMMUNITY): Payer: Self-pay

## 2012-01-11 DIAGNOSIS — R45851 Suicidal ideations: Secondary | ICD-10-CM

## 2012-01-11 DIAGNOSIS — Z79899 Other long term (current) drug therapy: Secondary | ICD-10-CM

## 2012-01-11 DIAGNOSIS — F32A Depression, unspecified: Secondary | ICD-10-CM

## 2012-01-11 DIAGNOSIS — E119 Type 2 diabetes mellitus without complications: Secondary | ICD-10-CM | POA: Diagnosis present

## 2012-01-11 DIAGNOSIS — F1910 Other psychoactive substance abuse, uncomplicated: Secondary | ICD-10-CM | POA: Diagnosis present

## 2012-01-11 DIAGNOSIS — F329 Major depressive disorder, single episode, unspecified: Secondary | ICD-10-CM

## 2012-01-11 DIAGNOSIS — F3289 Other specified depressive episodes: Principal | ICD-10-CM | POA: Diagnosis present

## 2012-01-11 DIAGNOSIS — T1491XA Suicide attempt, initial encounter: Secondary | ICD-10-CM | POA: Diagnosis present

## 2012-01-11 LAB — BASIC METABOLIC PANEL
BUN: 5 mg/dL — ABNORMAL LOW (ref 6–23)
CO2: 27 mEq/L (ref 19–32)
Calcium: 8.7 mg/dL (ref 8.4–10.5)
Chloride: 104 mEq/L (ref 96–112)
Creatinine, Ser: 0.78 mg/dL (ref 0.50–1.10)
GFR calc Af Amer: >90 mL/min (ref >90)
GFR calc non Af Amer: >90 mL/min (ref >90)
Glucose, Bld: 128 mg/dL — ABNORMAL HIGH (ref 70–99)
Potassium: 3.9 mEq/L (ref 3.5–5.1)
Sodium: 136 mEq/L (ref 135–145)

## 2012-01-11 LAB — GLUCOSE, CAPILLARY
Glucose-Capillary: 102 mg/dL — ABNORMAL HIGH (ref 70–99)
Glucose-Capillary: 109 mg/dL — ABNORMAL HIGH (ref 70–99)
Glucose-Capillary: 128 mg/dL — ABNORMAL HIGH (ref 70–99)
Glucose-Capillary: 126 mg/dL — ABNORMAL HIGH (ref 70–99)
Glucose-Capillary: 137 mg/dL — ABNORMAL HIGH (ref 70–99)

## 2012-01-11 MED ORDER — SERTRALINE HCL 50 MG PO TABS
50.0000 mg | ORAL_TABLET | Freq: Every day | ORAL | Status: DC
  Administered 2012-01-12 – 2012-01-16 (×5): 50 mg via ORAL
  Filled 2012-01-11 (×5): qty 1
  Filled 2012-01-11: qty 7
  Filled 2012-01-11 (×2): qty 1

## 2012-01-11 MED ORDER — MAGNESIUM HYDROXIDE 400 MG/5ML PO SUSP: 30.0000 mL | Freq: Every day | ORAL | Status: DC | PRN

## 2012-01-11 MED ORDER — AZITHROMYCIN 500 MG PO TABS: 500.0000 mg | ORAL_TABLET | Freq: Every day | ORAL | Status: DC

## 2012-01-11 MED ORDER — ACETAMINOPHEN 325 MG PO TABS
650.0000 mg | ORAL_TABLET | Freq: Four times a day (QID) | ORAL | Status: DC | PRN
Start: 2012-01-11 — End: 2012-01-16
  Administered 2012-01-12: 650 mg via ORAL

## 2012-01-11 MED ORDER — NICOTINE 7 MG/24HR TD PT24
7.0000 mg | MEDICATED_PATCH | Freq: Every day | TRANSDERMAL | Status: DC
  Administered 2012-01-12 – 2012-01-14 (×3): 7 mg via TRANSDERMAL
  Filled 2012-01-11 (×4): qty 1

## 2012-01-11 MED ORDER — ALUM & MAG HYDROXIDE-SIMETH 200-200-20 MG/5ML PO SUSP: 30.0000 mL | ORAL | Status: DC | PRN

## 2012-01-11 MED ORDER — PANTOPRAZOLE SODIUM 40 MG PO TBEC
40.0000 mg | DELAYED_RELEASE_TABLET | Freq: Two times a day (BID) | ORAL | Status: DC
  Administered 2012-01-12 – 2012-01-16 (×9): 40 mg via ORAL
  Filled 2012-01-11 (×2): qty 1
  Filled 2012-01-11: qty 14
  Filled 2012-01-11 (×4): qty 1
  Filled 2012-01-11: qty 14
  Filled 2012-01-11 (×6): qty 1

## 2012-01-11 MED ORDER — AZITHROMYCIN 500 MG PO TABS
500.0000 mg | ORAL_TABLET | Freq: Every day | ORAL | Status: DC
  Administered 2012-01-11: 500 mg via ORAL
  Filled 2012-01-11: qty 1

## 2012-01-11 MED ORDER — INSULIN ASPART 100 UNIT/ML ~~LOC~~ SOLN
0.0000 [IU] | SUBCUTANEOUS | Status: DC
  Administered 2012-01-11: 1 [IU] via SUBCUTANEOUS

## 2012-01-11 MED ORDER — NICOTINE 14 MG/24HR TD PT24
MEDICATED_PATCH | TRANSDERMAL | Status: AC
  Administered 2012-01-11: 18:00:00
  Filled 2012-01-11: qty 1

## 2012-01-11 MED ORDER — TRAZODONE HCL 50 MG PO TABS
50.0000 mg | ORAL_TABLET | Freq: Every day | ORAL | Status: DC
  Administered 2012-01-11: 50 mg via ORAL
  Filled 2012-01-11 (×2): qty 1

## 2012-01-11 MED ORDER — ARIPIPRAZOLE 10 MG PO TABS
10.0000 mg | ORAL_TABLET | Freq: Every day | ORAL | Status: DC
  Administered 2012-01-12 – 2012-01-13 (×2): 10 mg via ORAL
  Filled 2012-01-11 (×5): qty 1

## 2012-01-11 NOTE — Progress Notes (Signed)
Pt to be d/c today to [facility]. Pt has agreed to go voluntarily and the MD resinded the prior IVC before d/c.   Pt and family agreeable with transfer to [facility]. Confirmed plans with facility.  Plan transfer via [employer] and escorted by a floor tech to [facility].   Leron Croak, LCSWA [place] Weekend Coverage [phone]

## 2012-01-11 NOTE — Progress Notes (Signed)
Patient was about to be discharged to [facility], Patient complaining of chest pain radiating to left shoulder and arm when breathing deeply, Patient also has bloody discharge from nose when blowing nose or sneezing, Changed Tegaderm bandage on wrist and applied antibiotic ointment with new Tegaderm bandage, patient resting will continue to monitor

## 2012-01-11 NOTE — Clinical Social Work Psychosocial (Signed)
[facility] Group Notes: (Clinical Social Work)   01/11/2012      Type of Therapy:  Group Therapy   Participation Level:  Did Not Attend    Ambrose Mantle, LCSW 01/11/2012, 4:48 PM

## 2012-01-11 NOTE — Progress Notes (Signed)
[facility] PROGRESS NOTE  Angel Mendez RUE:[id number] DOB: 1965/08/21 DOA: 01/08/2012 PCP: Lonia Blood, MD  Brief narrative:  47 year old female with past medical history of depression and diabetes who is admitted for suicidal attempt. Patient has swallowed all of her metformin pills and had drank bleach. Patient had endoscopy done 01/09/2011 with findings of gastritis. She will continue taking Protonix.   Patient has cough and had CXR done 01/10/212 with mild bronchitic changes. We will prescribe zithromax for 5 days. Stable to d/c to [facility]. Please refer to D/C summary done 01/10/2012  Assessment/Plan:    Principal Problem:  *Suicide attempt  Appreciate psychiatric consultation   Active Problems:  Depression  Continue abilify and sertraline; this may be adjusted by psychiatry Diabetes mellitus, type II  MAY CONTINUE METFORMIN ON DISCHARGE   Code Status: full code  Family Communication: no family at bedside  Disposition Plan: To behavioral health    Manson Passey, MD  [facility]  Pager [phone]   Consultants:  GI  Psych Procedures:  EGD 01/09/2012 Antibiotics:  Zithromax 01/11/2012 -->  If 7PM-7AM, please contact night-coverage [email or web address] Password [facility] 01/11/2012, 8:48 AM   LOS: 3 days   HPI/Subjective: No acute overnight events. Still mild o\cough.  Objective: Filed Vitals:   01/10/12 0618 01/10/12 1425 01/10/12 2202 01/11/12 0610  BP: 114/63 110/67 121/74 130/71  Pulse: 76 70 85 84  Temp: 98.2 F (36.8 C) 97.5 F (36.4 C) 98.3 F (36.8 C) 97.9 F (36.6 C)  TempSrc: Oral Oral Oral Oral  Resp: 16 18 20 20   Height:      Weight:      SpO2: 95% 97% 93% 97%    Intake/Output Summary (Last 24 hours) at 01/11/12 0848 Last data filed at 01/10/12 1400  Gross per 24 hour  Intake   2980 ml  Output      0 ml  Net   2980 ml    Exam:   General:  Pt is alert, follows commands appropriately, not in acute distress  Cardiovascular: Regular rate  and rhythm, S1/S2, no murmurs, no rubs, no gallops  Respiratory: Clear to auscultation bilaterally, no wheezing, no crackles, no rhonchi  Abdomen: Soft, non tender, non distended, bowel sounds present, no guarding  Extremities: No edema, pulses DP and PT palpable bilaterally  Neuro: Grossly nonfocal  Data Reviewed: Basic Metabolic Panel:  Lab 01/10/12 6213 01/10/12 0515 01/09/12 2245 01/09/12 1255 01/08/12 2316  NA 136 134* 133* 135 139  K 4.2 3.9 4.0 4.4 4.7  CL 105 107 102 104 104  CO2 25 22 22 19 23   GLUCOSE 113* 97 103* 122* 103*  BUN 5* 6 9 13 6   CREATININE 0.84 0.81 0.94 1.40* 1.32*  CALCIUM 8.2* 7.9* 8.1* 8.3* 9.2  MG -- -- -- -- --  PHOS -- -- -- -- --   Liver Function Tests:  Lab 01/08/12 1921  AST 61*  ALT 51*  ALKPHOS 96  BILITOT 0.3  PROT 7.9  ALBUMIN 3.8    Lab 01/08/12 1921  LIPASE 27  AMYLASE --   No results found for this basename: AMMONIA:5 in the last 168 hours CBC:  Lab 01/08/12 1921  WBC 16.0*  NEUTROABS 7.5  HGB 16.1*  HCT 46.1*  MCV 92.9  PLT 249   Cardiac Enzymes:  Lab 01/10/12 2305  CKTOTAL --  CKMB --  CKMBINDEX --  TROPONINI <0.30   BNP: No components found with this basename: POCBNP:5 CBG:  Lab 01/11/12 0014 01/10/12 2024 01/10/12  1656 01/10/12 1150 01/10/12 0747  GLUCAP 102* 134* 116* 112* 104*    Recent Results (from the past 240 hour(s))  URINE CULTURE     Status: Normal   Collection Time   01/09/12  6:29 AM      Component Value Range Status Comment   Specimen Description URINE, CLEAN CATCH   Final    Special Requests NONE   Final    Culture  Setup Time 01/09/2012 13:51   Final    Colony Count 30,000 COLONIES/ML   Final    Culture     Final    Value: Multiple bacterial morphotypes present, none predominant. Suggest appropriate recollection if clinically indicated.   Report Status 01/10/2012 FINAL   Final      Studies: Dg Chest 2 View  01/11/2012  *RADIOLOGY REPORT*  Clinical Data: Cough.  Chest pain.  CHEST  - 2 VIEW  Comparison: 01/08/2012.  Findings: Normal sized heart.  Clear lungs.  Mild to moderate diffuse peribronchial thickening with mild progression.  No airspace consolidation.  Unremarkable bones.  IMPRESSION: Mild to moderate bronchitic changes with mild progression.   Original Report Authenticated By: Beckie Salts, M.D.     Scheduled Meds:   . ARIPiprazole  10 mg Oral Daily  . azithromycin  500 mg Oral Daily  . enoxaparin (LOVENOX) injection  40 mg Subcutaneous QHS  . fentaNYL  50 mcg Intravenous Once  . insulin aspart  0-9 Units Subcutaneous Q4H  . nicotine  7 mg Transdermal Daily  . pantoprazole  40 mg Oral BID  . sertraline  50 mg Oral Daily  . sodium chloride  3 mL Intravenous Q12H  . traZODone  100 mg Oral QHS   Continuous Infusions:   . sodium chloride 125 mL/hr at 01/10/12 [phone]
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
## 2012-01-12 DIAGNOSIS — F329 Major depressive disorder, single episode, unspecified: Principal | ICD-10-CM

## 2012-01-12 DIAGNOSIS — F1910 Other psychoactive substance abuse, uncomplicated: Secondary | ICD-10-CM

## 2012-01-12 LAB — GLUCOSE, CAPILLARY
Glucose-Capillary: 137 mg/dL — ABNORMAL HIGH (ref 70–99)
Glucose-Capillary: 115 mg/dL — ABNORMAL HIGH (ref 70–99)
Glucose-Capillary: 111 mg/dL — ABNORMAL HIGH (ref 70–99)
Glucose-Capillary: 116 mg/dL — ABNORMAL HIGH (ref 70–99)

## 2012-01-12 MED ORDER — NICOTINE 14 MG/24HR TD PT24
MEDICATED_PATCH | TRANSDERMAL | Status: AC
  Administered 2012-01-12: 14 mg
  Filled 2012-01-12: qty 1

## 2012-01-12 MED ORDER — METFORMIN HCL 500 MG PO TABS
500.0000 mg | ORAL_TABLET | Freq: Once | ORAL | Status: AC
  Administered 2012-01-12: 500 mg via ORAL
  Filled 2012-01-12: qty 1

## 2012-01-12 MED ORDER — TRAZODONE HCL 100 MG PO TABS
100.0000 mg | ORAL_TABLET | Freq: Every day | ORAL | Status: DC
  Administered 2012-01-12 – 2012-01-15 (×4): 100 mg via ORAL
  Filled 2012-01-12: qty 7
  Filled 2012-01-12 (×5): qty 1

## 2012-01-12 MED ORDER — METFORMIN HCL 500 MG PO TABS
500.0000 mg | ORAL_TABLET | Freq: Two times a day (BID) | ORAL | Status: DC
  Administered 2012-01-12 – 2012-01-16 (×8): 500 mg via ORAL
  Filled 2012-01-12: qty 14
  Filled 2012-01-12 (×4): qty 1
  Filled 2012-01-12: qty 14
  Filled 2012-01-12 (×6): qty 1

## 2012-01-12 MED ORDER — INSULIN ASPART 100 UNIT/ML ~~LOC~~ SOLN: 0.0000 [IU] | Freq: Three times a day (TID) | SUBCUTANEOUS | Status: DC

## 2012-01-12 NOTE — Progress Notes (Signed)
Angel Mendez  is sad, depressed, quiet and has a flat affect. She makes brief eye contact. She frequently looks down...choosing not to maintain eye  contact. She does not attend her LIfe SKills group this morning nor did she attend her goals group.   A SHe did complete her AM self inventory and on it she wrote she denied SI within the past 24 hrs, she rated her depression and hopelessness " 5/2"  And she stated her DC plan is to " take my meds, walk  And talk".   R Safety is in place and POCincludes beginning the pt on glucophage bid and sliding scale insulin for diabetes control.

## 2012-01-12 NOTE — Progress Notes (Signed)
D: Pt resting in bed, eyes closed, respirations even and unlabored.  Has awoken several times during night and approached nursing station for attention and has spent part of night sitting in hallway chair.  A: Will continue to monitor.  R: In no apparent distress.  Safety maintained. Dion Saucier RN
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
## 2012-01-12 NOTE — Progress Notes (Signed)
[facility] Group Notes:  (Counselor/Nursing/MHT/Case Management/Adjunct)  01/12/2012 12:06 AM  Type of Therapy:  Psychoeducational Skills  Participation Level:  Minimal  Participation Quality:  Resistant  Affect:  Depressed  Cognitive:  Appropriate  Insight:  Developing/Improving  Engagement in Group:  Limited  Engagement in Therapy:  Limited  Modes of Intervention:  Education  Summary of Progress/Problems: The patient was initially hesitant to speak in group, but that changed after a short period of time. She describes her first day in the hospital as being a "tough first day". In addition, she indicated that prior to admission her brother gave her the option of either attending an 72 month program or come here to [facility]. She states that she doesn't want to be in the hospital at this time. Her goal for tomorrow is to "stay alive".    Lanelle Lindo S 01/12/2012, 12:06 AM
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
## 2012-01-12 NOTE — Progress Notes (Signed)
[facility] Group Notes:  (Counselor/Nursing/MHT/Case Management/Adjunct)  01/12/2012 11:25 PM  Type of Therapy:  Psychoeducational Skills  Participation Level:  Active  Participation Quality:  Appropriate  Affect:  Appropriate  Cognitive:  Appropriate  Insight:  Engaged  Engagement in Group:  Improving  Engagement in Therapy:  Improving  Modes of Intervention:  Education  Summary of Progress/Problems: The patient participated more in group this evening as compared to last night. She stated that she was upset over the belongings that had been dropped off by her family. The visit with her family member however worked out well. Her goals for tomorrow are to attend more of the groups and to prepare for her discharge.    Hazle Coca S 01/12/2012, 11:25 PM

## 2012-01-12 NOTE — Progress Notes (Signed)
Psychoeducational Group Note  Date:  01/12/2012 Time:  1315  Group Topic/Focus:  Conflict Resolution:   The focus of this group is to discuss the conflict resolution process and how it may be used upon discharge.  Participation Level:  Active  Participation Quality:  Appropriate, Sharing and Supportive  Affect:  Appropriate  Cognitive:  Appropriate  Insight:  Engaged  Engagement in Group:  Engaged    Dalia Heading 01/12/2012, 3:57 PM

## 2012-01-13 LAB — GLUCOSE, CAPILLARY
Glucose-Capillary: 105 mg/dL — ABNORMAL HIGH (ref 70–99)
Glucose-Capillary: 108 mg/dL — ABNORMAL HIGH (ref 70–99)
Glucose-Capillary: 119 mg/dL — ABNORMAL HIGH (ref 70–99)
Glucose-Capillary: 96 mg/dL (ref 70–99)

## 2012-01-13 NOTE — H&P (Signed)
Medical/psychiatric screening examination/treatment/procedure(s) were performed by non-physician practitioner and as supervising physician I was immediately available for consultation/collaboration.  I agree with the major elements of this evaluation.

## 2012-01-13 NOTE — Progress Notes (Signed)
(  D)Pt has been appropriate in affect, depressed in mood. Pt shared that she took a 3 hour nap and has regularly been sleepy during the day. Pt reported even more sleepiness today. Pt shared that she takes her Abilify in the morning and will talk to the doctor about the possibility of moving that to the evening to see if that will help with sleep. Pt shared that she had promised her brother that she would attend all her groups today. Pt reported that she went to her morning groups but was too tired this afternoon to make it to group and took a nap instead. Pt shared that she will attend group this evening. Pt asked to have her band aid changed that covers her laceration on left wrist. There were no signs of infection, laceration seems to be healing well. (A)Support and encouragement given 1:1 interaction offered and given as needed. Band aid changed and wound assessed. (R)Pt receptive.

## 2012-01-13 NOTE — Progress Notes (Signed)
[facility] LCSW Group Therapy  01/13/2012 3:24 PM  Type of Therapy:  Did not attend  Terrilyn Saver 01/13/2012, 3:24 PM

## 2012-01-13 NOTE — Tx Team (Signed)
Interdisciplinary Treatment Plan Update (Adult)  Date:  01/13/2012  Time Reviewed:  9:56 AM   Progress in Treatment: Attending groups:   Yes   Participating in groups:  Yes Taking medication as prescribed:  Yes Tolerating medication:  Yes Family/Significant othe contact made: Contact will be made with family Patient understands diagnosis:  Yes Discussing patient identified problems/goals with staff: Yes Medical problems stabilized or resolved: Yes Denies suicidal/homicidal ideation:Yes Issues/concerns per patient self-inventory:  Other:   New problem(s) identified:  Reason for Continuation of Hospitalization: Anxiety Depression Medication stabilization   Interventions implemented related to continuation of hospitalization:  Medication Management; safety checks q 15 mins  Additional comments:  Estimated length of stay:  2-3 days  Discharge Plan:  Home with outpatient follow up  New goal(s):  Review of initial/current patient goals per problem list:    1.  Goal(s): Eliminate SI/other thoughts of self harm   Met:  Yes  Target date: d/c  As evidenced by: Patient no longer endorsing SI/HI or other thoughts of self harm.    2.  Goal (s):Reduce depression/anxiety (rates symptoms at one)  Met: Yes  Target date: d/c  As evidenced by: Patient currently rating symptoms at four or below    3.  Goal(s):.stabilize on meds   Met:  Yes  Target date: d/c  As evidenced by: Patient reports being stabilized on medications - less symptomatic    4.  Goal(s): Refer for outpatient follow up   Met:  No  Target date: d/c  As evidenced by: Follow up appointment will be scheduled    Attendees: Patient:   01/13/2012 9:56 AM  Physican:  Patrick North, MD 01/13/2012 9:56 AM  Nursing:  Neill Loft, RN 01/13/2012 9:56 AM   Nursing:  Quintella Reichert , RN 01/13/2012 9:56 AM   Clinical Social Worker:  Juline Patch, LCSW 01/13/2012 9:56 AM   Other: Tera Helper, PHM-NP  01/13/2012 9:56 AM   Other:   01/13/2012 9:56 AM Other:        01/13/2012 9:56 AM

## 2012-01-13 NOTE — Progress Notes (Signed)
Patient ID: Angel Mendez SERVICE, female   DOB: Nov 13, 1965, 47 y.o.   MRN: [id number] D- Patient reports fair sleep and good appetite.  Her energy level is normal and her ability to pay attention is good.  A- talked with patient about her knees and she said R knee ha been repaired  And Left knee needs to be repaired.  Gait appears steady.  A Talked with patient about cut on her wrist. R- It is healing , no drainage, covered with bandage.  She no longer has thoughts of self harm.

## 2012-01-13 NOTE — Progress Notes (Signed)
[facility] MD Progress Note  01/13/2012 1:20 PM Angel Mendez  MRN:  [id number] Subjective:  Patient reports being depressed, very sleepy.   Diagnosis:   Axis I: Depressive Disorder NOS and Substance Abuse Axis II: Deferred Axis III:  Past Medical History  Diagnosis Date  . Seasonal allergies   . Diabetes mellitus     NIDDM  . Meniscus tear 04/2011    right knee  . Depression     currently on meds  . GERD (gastroesophageal reflux disease)     treats w/OTC meds   Axis IV: other psychosocial or environmental problems Axis V: 41-50 serious symptoms  ADL's:  Intact  Sleep: Fair  Appetite:  Fair   Psychiatric Specialty Exam: Review of Systems  Constitutional: Positive for malaise/fatigue.  HENT: Negative.   Eyes: Negative.   Respiratory: Negative.   Cardiovascular: Negative.   Gastrointestinal: Negative.   Genitourinary: Negative.   Musculoskeletal: Negative.   Skin: Negative.   Neurological: Negative.   Endo/Heme/Allergies: Negative.   Psychiatric/Behavioral: Positive for depression and suicidal ideas. The patient is nervous/anxious.     Blood pressure 103/70, pulse 89, temperature 98 F (36.7 C), temperature source Oral, resp. rate 16, height 5' 7.5" (1.715 m), weight 97.523 kg (215 lb), last menstrual period 01/10/2012.Body mass index is 33.18 kg/(m^2).  General Appearance: Casual  Eye Contact::  Minimal  Speech:  Garbled  Volume:  Decreased  Mood:  Depressed and Dysphoric  Affect:  Constricted  Thought Process:  Circumstantial  Orientation:  Full (Time, Place, and Person)  Thought Content:  Rumination  Suicidal Thoughts:  Yes.  without intent/plan  Homicidal Thoughts:  No  Memory:  Immediate;   Fair Recent;   Fair Remote;   Fair  Judgement:  Fair  Insight:  Present  Psychomotor Activity:  Normal  Concentration:  Fair  Recall:  Fair  Akathisia:  No  Handed:  Right  AIMS (if indicated):     Assets:  Communication Skills  Sleep:  Number of  Hours: 5.75    Current Medications: Current Facility-Administered Medications  Medication Dose Route Frequency Provider Last Rate Last Dose  . acetaminophen (TYLENOL) tablet 650 mg  650 mg Oral Q6H PRN Shuvon Rankin, NP   650 mg at 01/12/12 0746  . alum & mag hydroxide-simeth (MAALOX/MYLANTA) 200-200-20 MG/5ML suspension 30 mL  30 mL Oral Q4H PRN Shuvon Rankin, NP      . ARIPiprazole (ABILIFY) tablet 10 mg  10 mg Oral Daily Shuvon Rankin, NP   10 mg at 01/13/12 0840  . insulin aspart (novoLOG) injection 0-9 Units  0-9 Units Subcutaneous TID WC Nanine Means, NP      . magnesium hydroxide (MILK OF MAGNESIA) suspension 30 mL  30 mL Oral Daily PRN Shuvon Rankin, NP      . metFORMIN (GLUCOPHAGE) tablet 500 mg  500 mg Oral BID WC Nanine Means, NP   500 mg at 01/13/12 0840  . nicotine (NICODERM CQ - dosed in mg/24 hr) patch 7 mg  7 mg Transdermal Daily Shuvon Rankin, NP   7 mg at 01/13/12 0839  . pantoprazole (PROTONIX) EC tablet 40 mg  40 mg Oral BID Shuvon Rankin, NP   40 mg at 01/13/12 0840  . sertraline (ZOLOFT) tablet 50 mg  50 mg Oral Daily Shuvon Rankin, NP   50 mg at 01/13/12 0840  . traZODone (DESYREL) tablet 100 mg  100 mg Oral QHS Nanine Means, NP   100 mg at 01/12/12 2229    Lab  Results:  Results for orders placed during the hospital encounter of 01/11/12 (from the past 48 hour(s))  GLUCOSE, CAPILLARY     Status: Abnormal   Collection Time   01/11/12  9:20 PM      Component Value Range Comment   Glucose-Capillary 137 (*) 70 - 99 mg/dL   GLUCOSE, CAPILLARY     Status: Abnormal   Collection Time   01/12/12  6:17 AM      Component Value Range Comment   Glucose-Capillary 137 (*) 70 - 99 mg/dL   GLUCOSE, CAPILLARY     Status: Abnormal   Collection Time   01/12/12 11:34 AM      Component Value Range Comment   Glucose-Capillary 115 (*) 70 - 99 mg/dL   GLUCOSE, CAPILLARY     Status: Abnormal   Collection Time   01/12/12  4:52 PM      Component Value Range Comment   Glucose-Capillary 111  (*) 70 - 99 mg/dL    Comment 1 Notify RN     GLUCOSE, CAPILLARY     Status: Abnormal   Collection Time   01/12/12  9:15 PM      Component Value Range Comment   Glucose-Capillary 116 (*) 70 - 99 mg/dL   GLUCOSE, CAPILLARY     Status: Abnormal   Collection Time   01/13/12  5:52 AM      Component Value Range Comment   Glucose-Capillary 105 (*) 70 - 99 mg/dL   GLUCOSE, CAPILLARY     Status: Abnormal   Collection Time   01/13/12 11:51 AM      Component Value Range Comment   Glucose-Capillary 108 (*) 70 - 99 mg/dL     Physical Findings: AIMS: Facial and Oral Movements Muscles of Facial Expression: None, normal Lips and Perioral Area: None, normal Jaw: None, normal Tongue: None, normal,Extremity Movements Upper (arms, wrists, hands, fingers): None, normal Lower (legs, knees, ankles, toes): None, normal, Trunk Movements Neck, shoulders, hips: None, normal, Overall Severity Severity of abnormal movements (highest score from questions above): None, normal Incapacitation due to abnormal movements: None, normal Patient's awareness of abnormal movements (rate only patient's report): No Awareness, Dental Status Current problems with teeth and/or dentures?: No Does patient usually wear dentures?:  (she is missing some teeth)  CIWA:    COWS:     Treatment Plan Summary: Daily contact with patient to assess and evaluate symptoms and progress in treatment Medication management  Plan: Continue current plan of care. Encourage patient to attend groups.  Medical Decision Making Problem Points:  Established problem, stable/improving (1), Review of last therapy session (1) and Review of psycho-social stressors (1) Data Points:  Review of medication regiment & side effects (2)  I certify that inpatient services furnished can reasonably be expected to improve the patient's condition.   Domitila Stetler 01/13/2012, 1:20 PM

## 2012-01-13 NOTE — Progress Notes (Signed)
Psychoeducational Group Note  Date:  01/13/2012 Time:  2015  Group Topic/Focus:  Wrap-up  Participation Level:  Active  Participation Quality:  Appropriate and Attentive  Affect:  Appropriate  Cognitive:  Appropriate  Insight:  Engaged  Engagement in Group:  Engaged  Additional Comments:    Humberto Seals Monique 01/13/2012, 10:27 PM

## 2012-01-13 NOTE — Progress Notes (Signed)
Pt assigned at 2300 shift change, so assessment done by previous RN.  D: Pt resting in bed, eyes closed, respirations even and unlabored.   A: Will continue to monitor.  R: In no apparent distress. Safety maintained. Dion Saucier RN
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
## 2012-01-14 LAB — GLUCOSE, CAPILLARY
Glucose-Capillary: 97 mg/dL (ref 70–99)
Glucose-Capillary: 103 mg/dL — ABNORMAL HIGH (ref 70–99)

## 2012-01-14 MED ORDER — NICOTINE 14 MG/24HR TD PT24
14.0000 mg | MEDICATED_PATCH | Freq: Every day | TRANSDERMAL | Status: DC
  Administered 2012-01-15 – 2012-01-16 (×2): 14 mg via TRANSDERMAL
  Filled 2012-01-14 (×4): qty 1

## 2012-01-14 MED ORDER — ARIPIPRAZOLE 10 MG PO TABS
10.0000 mg | ORAL_TABLET | Freq: Every day | ORAL | Status: DC
  Administered 2012-01-14 – 2012-01-15 (×2): 10 mg via ORAL
  Filled 2012-01-14: qty 1
  Filled 2012-01-14: qty 7
  Filled 2012-01-14 (×2): qty 1

## 2012-01-14 NOTE — Progress Notes (Signed)
Patient ID: Angel Mendez, female   DOB: [birth year], 47 y.o.   MRN: [id number] D: Pt. Visible on the milieu, interacting with other clients and in dayroom watching TV.  Pt. Reports reason for admission "I tried to kill myself". Pt. Says after thinking about it "I was upset at myself"  "I know I'm not going to do that again". A: Writer introduced self to client and reviewed stressors that lead up to recent even. Pt. Reported some martial problems but states she and husband will be getting back together. 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 Staff will monitor q66min for safety. Staff encouraged group. R: Pt. Is safe on the unit. Pt. Attends group.

## 2012-01-14 NOTE — Progress Notes (Signed)
Psychoeducational Group Note  Date:  01/14/2012 Time: 2000  Group Topic/Focus:  Wrap-Up Group:   The focus of this group is to help patients review their daily goal of treatment and discuss progress on daily workbooks.  Participation Level:  Active  Participation Quality:  Appropriate  Affect:  Appropriate  Cognitive:  Appropriate  Insight:  Engaged  Engagement in Group:  Engaged  Additional Comments:  Pt participated in wrap-up group this evening.    Pearly Apachito A 01/14/2012, 9:51 PM

## 2012-01-14 NOTE — Progress Notes (Signed)
Patient ID: Angel Mendez, female   DOB: 1965/10/30, 47 y.o.   MRN: [id number] Pt visible in the milieu.  Interacting appropriately with staff and peers.  Needs assessed. Pt denies.  Support and encouragement given.  Fifteen minute checks in progress. Pt safe on unit.

## 2012-01-14 NOTE — Progress Notes (Signed)
[facility] MD Progress Note  01/14/2012 10:50 AM Angel Mendez  MRN:  [id number] Subjective:  Patient reports sleeping well, however sleeping too much during the day since she is taking Abilify in the mornings.  Mood is slightly better, in touch with her family.  Diagnosis:   Axis I: Depressive Disorder NOS Axis II: Deferred Axis III:  Past Medical History  Diagnosis Date  . Seasonal allergies   . Diabetes mellitus     NIDDM  . Meniscus tear 04/2011    right knee  . Depression     currently on meds  . GERD (gastroesophageal reflux disease)     treats w/OTC meds   Axis IV: other psychosocial or environmental problems Axis V: 41-50 serious symptoms  ADL's:  Intact  Sleep: Fair  Appetite:  Fair   Psychiatric Specialty Exam: Review of Systems  Constitutional: Negative.   HENT: Negative.   Eyes: Negative.   Respiratory: Negative.   Cardiovascular: Negative.   Gastrointestinal: Negative.   Genitourinary: Negative.   Musculoskeletal: Negative.   Skin: Negative.   Neurological: Negative.   Endo/Heme/Allergies: Negative.   Psychiatric/Behavioral: Positive for depression. The patient is nervous/anxious.     Blood pressure 127/87, pulse 91, temperature 98.4 F (36.9 C), temperature source Oral, resp. rate 16, height 5' 7.5" (1.715 m), weight 97.523 kg (215 lb), last menstrual period 01/10/2012.Body mass index is 33.18 kg/(m^2).  General Appearance: Casual  Eye Contact::  Fair  Speech:  Normal Rate  Volume:  Normal  Mood:  Anxious and Dysphoric  Affect:  Depressed  Thought Process:  Coherent  Orientation:  Full (Time, Place, and Person)  Thought Content:  WDL  Suicidal Thoughts:  No  Homicidal Thoughts:  No  Memory:  Immediate;   Fair Recent;   Fair Remote;   Fair  Judgement:  Fair  Insight:  Present  Psychomotor Activity:  Normal  Concentration:  Fair  Recall:  Fair  Akathisia:  No  Handed:  Right  AIMS (if indicated):     Assets:  Communication  Skills Desire for Improvement  Sleep:  Number of Hours: 5.75    Current Medications: Current Facility-Administered Medications  Medication Dose Route Frequency Provider Last Rate Last Dose  . acetaminophen (TYLENOL) tablet 650 mg  650 mg Oral Q6H PRN Shuvon Rankin, NP   650 mg at 01/12/12 0746  . alum & mag hydroxide-simeth (MAALOX/MYLANTA) 200-200-20 MG/5ML suspension 30 mL  30 mL Oral Q4H PRN Shuvon Rankin, NP      . ARIPiprazole (ABILIFY) tablet 10 mg  10 mg Oral Daily Shuvon Rankin, NP   10 mg at 01/13/12 0840  . insulin aspart (novoLOG) injection 0-9 Units  0-9 Units Subcutaneous TID WC Nanine Means, NP      . magnesium hydroxide (MILK OF MAGNESIA) suspension 30 mL  30 mL Oral Daily PRN Shuvon Rankin, NP      . metFORMIN (GLUCOPHAGE) tablet 500 mg  500 mg Oral BID WC Nanine Means, NP   500 mg at 01/14/12 0807  . nicotine (NICODERM CQ - dosed in mg/24 hours) patch 14 mg  14 mg Transdermal Q0600 Keyion Knack, MD      . pantoprazole (PROTONIX) EC tablet 40 mg  40 mg Oral BID Shuvon Rankin, NP   40 mg at 01/14/12 0807  . sertraline (ZOLOFT) tablet 50 mg  50 mg Oral Daily Shuvon Rankin, NP   50 mg at 01/14/12 0807  . traZODone (DESYREL) tablet 100 mg  100 mg Oral QHS Catha Nottingham  Lord, NP   100 mg at 01/13/12 2159    Lab Results:  Results for orders placed during the hospital encounter of 01/11/12 (from the past 48 hour(s))  GLUCOSE, CAPILLARY     Status: Abnormal   Collection Time   01/12/12 11:34 AM      Component Value Range Comment   Glucose-Capillary 115 (*) 70 - 99 mg/dL   GLUCOSE, CAPILLARY     Status: Abnormal   Collection Time   01/12/12  4:52 PM      Component Value Range Comment   Glucose-Capillary 111 (*) 70 - 99 mg/dL    Comment 1 Notify RN     GLUCOSE, CAPILLARY     Status: Abnormal   Collection Time   01/12/12  9:15 PM      Component Value Range Comment   Glucose-Capillary 116 (*) 70 - 99 mg/dL   GLUCOSE, CAPILLARY     Status: Abnormal   Collection Time   01/13/12  5:52 AM       Component Value Range Comment   Glucose-Capillary 105 (*) 70 - 99 mg/dL   GLUCOSE, CAPILLARY     Status: Abnormal   Collection Time   01/13/12 11:51 AM      Component Value Range Comment   Glucose-Capillary 108 (*) 70 - 99 mg/dL   GLUCOSE, CAPILLARY     Status: Abnormal   Collection Time   01/13/12  5:10 PM      Component Value Range Comment   Glucose-Capillary 119 (*) 70 - 99 mg/dL    Comment 1 Documented in Chart      Comment 2 Notify RN     GLUCOSE, CAPILLARY     Status: Normal   Collection Time   01/13/12  8:57 PM      Component Value Range Comment   Glucose-Capillary 96  70 - 99 mg/dL    Comment 1 Notify RN       Physical Findings: AIMS: Facial and Oral Movements Muscles of Facial Expression: None, normal Lips and Perioral Area: None, normal Jaw: None, normal Tongue: None, normal,Extremity Movements Upper (arms, wrists, hands, fingers): None, normal Lower (legs, knees, ankles, toes): None, normal, Trunk Movements Neck, shoulders, hips: None, normal, Overall Severity Severity of abnormal movements (highest score from questions above): None, normal Incapacitation due to abnormal movements: None, normal Patient's awareness of abnormal movements (rate only patient's report): No Awareness, Dental Status Current problems with teeth and/or dentures?: No Does patient usually wear dentures?: No  CIWA:    COWS:     Treatment Plan Summary: Daily contact with patient to assess and evaluate symptoms and progress in treatment Medication management  Plan: Change Abilify to night time dosage. Change frequency of cbg to once daily since blood sugars are stable. Plan for discharge.  Medical Decision Making Problem Points:  Established problem, stable/improving (1), Review of last therapy session (1) and Review of psycho-social stressors (1) Data Points:  Review of medication regiment & side effects (2) Review of new medications or change in dosage (2)  I certify that inpatient  services furnished can reasonably be expected to improve the patient's condition.   [name] 01/14/2012, 10:50 AM

## 2012-01-14 NOTE — Progress Notes (Signed)
Patient ID: Angel Mendez, female   DOB: 1965-08-06, 46 y.o.   MRN: [id number] D- Patient reports sleeping well and good appetite.  Her energy level is normal and she denies felling depressed or hopeless.  She is expressing concern about a peer who is saying things to her "thats not good" and "she is bumping into me" She has volunteered to tell staff if anything else occurs.  A- talked with pat about her appropriate response of notifying staff .  Changed bandage on wrist.  R-Cut is not draining and no sign of infection.  Abilfy held this am as patient says it makes her sleepy and she prefers to take it at HS.  Talked with MD about this request.

## 2012-01-14 NOTE — Progress Notes (Signed)
Psychoeducational Group Note  Date:  01/14/2012 Time:  1100  Group Topic/Focus:  Recovery Goals:   The focus of this group is to identify appropriate goals for recovery and establish a plan to achieve them.    Angel Mendez Bear Dance 01/14/2012, 2:08 PM

## 2012-01-15 LAB — GLUCOSE, CAPILLARY
Glucose-Capillary: 119 mg/dL — ABNORMAL HIGH (ref 70–99)
Glucose-Capillary: 100 mg/dL — ABNORMAL HIGH (ref 70–99)
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
## 2012-01-15 NOTE — Progress Notes (Signed)
Psychoeducational Group Note  Date:  01/15/2012 Time:  2000  Group Topic/Focus:  Wrap-Up Group:   The focus of this group is to help patients review their daily goal of treatment and discuss progress on daily workbooks.  Participation Level:  Active  Participation Quality:  Appropriate  Affect:  Appropriate  Cognitive:  Alert  Insight:  Engaged  Engagement in Group:  Engaged  Additional Comments:  Pt stated that her day started off great, but saw something on the news that made her end up having a bad day.  Kaleen Odea R 01/15/2012, 9:40 PM

## 2012-01-15 NOTE — Progress Notes (Signed)
[facility] LCSW Aftercare Discharge Planning Group Note      Emotional Regulation 1:15 - 2:30 PM         01/15/2012 3:04 PM  Participation Quality:  Did not attend group

## 2012-01-15 NOTE — Tx Team (Signed)
Interdisciplinary Treatment Plan Update (Adult)  Date:  01/15/2012  Time Reviewed:  9:42 AM   Progress in Treatment: Attending groups:   Yes   Participating in groups:  Yes Taking medication as prescribed:  Yes Tolerating medication:  Yes Family/Significant othe contact made: Contact to be made with family Patient understands diagnosis:  Yes Discussing patient identified problems/goals with staff: Yes Medical problems stabilized or resolved: Yes Denies suicidal/homicidal ideation:Yes Issues/concerns per patient self-inventory:  Other:   New problem(s) identified:  Reason for Continuation of Hospitalization: Anxiety Depression Medication stabilization  Interventions implemented related to continuation of hospitalization:  Medication Management; safety checks q 15 mins  Additional comments:  Estimated length of stay:  2-3 days  Discharge Plan:  Home with outpatient follow up  New goal(s):  Review of initial/current patient goals per problem list:    1.  Goal(s): Eliminate SI/other thoughts of self harm   Met:  Yes  Target date: d/c  As evidenced by: Patient no longer endorsing SI/HI or other thoughts of self harm.    2.  Goal (s):Reduce anxiety (rated at seven today)  Met: Yes  Target date: d/c  As evidenced by: Patient will rate symptoms at four or below    3.  Goal(s):.stabilize on meds   Met:  No  Target date: d/c  As evidenced by: Patient will report being stabilized on medications - less symptomatic    4.  Goal(s): Refer for outpatient follow up   Met:  No  Target date: d/c  As evidenced by: Follow up appointment will be scheduled    Attendees: Patient:   01/15/2012 9:42 AM  Physican:  Patrick North, MD 01/15/2012 9:42 AM  Nursing:  Berneice Heinrich, RN 01/15/2012 9:42 AM   Nursing:   Leighton Parody, RN 01/15/2012 9:42 AM   Clinical Social Worker:  Juline Patch, LCSW 01/15/2012 9:42 AM   Other: Tera Helper, PHM-NP 01/15/2012 9:42 AM   Other:          01/15/2012 9:42 AM Other:        01/15/2012 9:42 AM

## 2012-01-15 NOTE — Progress Notes (Signed)
Patient resting quietly in bed, no signs or symptoms of distress. Patient safety maintained with Q 15 minute checks. Patient remains safe on the unit.
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
## 2012-01-15 NOTE — Progress Notes (Signed)
D: Patient denies SI/HI and A/V hallucinations; patient reports sleep to be fair; reports appetite to be good ; reports energy level is normal ; reports ability to pay attention is good; rates depression as 3/10; rates hopelessness 1/10;    A: Monitored q 15 minutes; patient encouraged to attend groups; patient educated about medications; patient given medications per physician orders; patient encouraged to express feelings and/or concerns  R: Patient is cooperative and flat in affect but appears to have some insight about treatment; patient's interaction with staff and peers is appropriate;patient is taking medications as prescribed and tolerating medications; patient is not attending all groups

## 2012-01-16 LAB — GLUCOSE, CAPILLARY: Glucose-Capillary: 87 mg/dL (ref 70–99)

## 2012-01-16 MED ORDER — SERTRALINE HCL 50 MG PO TABS: 50.0000 mg | ORAL_TABLET | Freq: Every day | ORAL | Status: DC

## 2012-01-16 MED ORDER — METFORMIN HCL 500 MG PO TABS: 500.0000 mg | ORAL_TABLET | Freq: Two times a day (BID) | ORAL | Status: DC

## 2012-01-16 MED ORDER — TRAZODONE HCL 100 MG PO TABS: 100.0000 mg | ORAL_TABLET | Freq: Every day | ORAL | Status: DC

## 2012-01-16 MED ORDER — ARIPIPRAZOLE 10 MG PO TABS: 10.0000 mg | ORAL_TABLET | Freq: Every day | ORAL | Status: DC

## 2012-01-16 MED ORDER — PANTOPRAZOLE SODIUM 40 MG PO TBEC: 40.0000 mg | DELAYED_RELEASE_TABLET | Freq: Two times a day (BID) | ORAL | Status: DC
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
## 2012-01-16 NOTE — Progress Notes (Signed)
[facility] INPATIENT:  Family/Significant Other Suicide Prevention Education  Suicide Prevention Education:  Education Completed; Sharrie Rothman, Brother, [phone] has been identified by the patient as the family member/significant other with whom the patient will be residing, and identified as the person(s) who will aid the patient in the event of a mental health crisis (suicidal ideations/suicide attempt).  With written consent from the patient, the family member/significant other has been provided the following suicide prevention education, prior to the and/or following the discharge of the patient.  The suicide prevention education provided includes the following:  Suicide risk factors  Suicide prevention and interventions  National Suicide Hotline telephone number  [facility] assessment telephone number  [facility] Emergency Assistance 911  [facility] and/or Residential Mobile Crisis Unit telephone number  Request made of family/significant other to:  Remove weapons (e.g., guns, rifles, knives), all items previously/currently identified as safety concern.  Brother reports patient has no guns at home.  Remove drugs/medications (over-the-counter, prescriptions, illicit drugs), all items previously/currently identified as a safety concern.  The family member/significant other verbalizes understanding of the suicide prevention education information provided.  The family member/significant other agrees to remove the items of safety concern listed above.  Wynn Banker 01/16/2012, 10:38 AM

## 2012-01-16 NOTE — Progress Notes (Signed)
[facility] LCSW Aftercare Discharge Planning Group Note 8:30 - 9:30 AM  01/16/2012 10:41 AM  Participation Quality:  Appropriate and Attentive  Affect:  Appropriate  Cognitive:  Alert and Appropriate  Insight:  Engaged  Engagement in Group:  Engaged  Modes of Intervention:  Exploration, Problem-solving and Support  Summary of Progress/Problems:  Patient reports doing well and being ready to discharge home today.  She denies SI/HI and rates all symptoms at one.  She was excited to learn that she has an appointment for an admission assessment at [facility] next Wednesday.  Wynn Banker 01/16/2012, 10:41 AM

## 2012-01-16 NOTE — [facility] Suicide Risk Assessment (Signed)
Suicide Risk Assessment  Discharge Assessment     Demographic Factors:  Female, African american  Mental Status Per Nursing Assessment::   On Admission:     Current Mental Status by Physician: Patient is alert and oriented to 4. Denies SI/HI/AH/VH.  Loss Factors: Loss of significant relationship  Historical Factors: Impulsivity  Risk Reduction Factors:   Living with another person, especially a relative  Continued Clinical Symptoms:  Depression:   Recent sense of peace/wellbeing  Cognitive Features That Contribute To Risk:  Cognitively intact  Suicide Risk:  Minimal: No identifiable suicidal ideation.  Patients presenting with no risk factors but with morbid ruminations; may be classified as minimal risk based on the severity of the depressive symptoms  Discharge Diagnoses:   AXIS I:  Depressive Disorder NOS and Substance Abuse AXIS II:  Deferred AXIS III:   Past Medical History  Diagnosis Date  . Seasonal allergies   . Diabetes mellitus     NIDDM  . Meniscus tear 04/2011    right knee  . Depression     currently on meds  . GERD (gastroesophageal reflux disease)     treats w/OTC meds   AXIS IV:  economic problems and housing problems AXIS V:  61-70 mild symptoms  Plan Of Care/Follow-up recommendations:  Activity:  normal Diet:  normal Follow [facility] with [facility].  Is patient on multiple antipsychotic therapies at discharge:  No   Has Patient had three or more failed trials of antipsychotic monotherapy by history:  No  Recommended Plan for Multiple Antipsychotic Therapies: NA  Nelsy Madonna 01/16/2012, 9:13 AM

## 2012-01-16 NOTE — Progress Notes (Signed)
[facility] Adult Case Management Discharge Plan :  Will you be returning to the same living situation after discharge: Yes,  Patient is discharging home but is scheduled for possible admission for residential treatment at [facility] on Wednesday, January 22, 2012 At discharge, do you have transportation home?:Yes,  Family to transport patient home Do you have the ability to pay for your medications:Yes,  Patient is able to obtain medications  Release of information consent forms completed and in the chart;  Patient's signature needed at discharge.  Patient to Follow up at: Follow-up Information    Follow up with [facility]. On 01/22/2012. (You are scheduled for an admission assessemnt at [facility] on Wednesday, January 22, 2011 at 8:00 AM)    Contact information:   [address] [place], [place]  [address]  [phone]         Patient denies SI/HI:   Yes,  Patient is not endorsing SI/HI or other thoughts of self harm    Safety Planning and Suicide Prevention discussed:  Yes,  Reviewed during aftercare groups  Lesslie Mckeehan, Joesph July 01/16/2012, 10:47 AM
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
## 2012-01-20 NOTE — Progress Notes (Signed)
Patient Discharge Instructions:  After Visit Summary (AVS):   Faxed to:  01/20/12 Psychiatric Admission Assessment Note:   Faxed to:  01/20/12 Suicide Risk Assessment - Discharge Assessment:   Faxed to:  01/20/12 Faxed/Sent to the Next Level Care provider:  01/20/12 Faxed to [facility] @ [phone]  Jerelene Redden, 01/20/2012, 3:28 PM
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
## 2012-01-28 ENCOUNTER — Encounter (HOSPITAL_BASED_OUTPATIENT_CLINIC_OR_DEPARTMENT_OTHER): Payer: Self-pay

## 2012-01-28 ENCOUNTER — Emergency Department (HOSPITAL_BASED_OUTPATIENT_CLINIC_OR_DEPARTMENT_OTHER): Payer: Medicaid Other

## 2012-01-28 ENCOUNTER — Emergency Department (HOSPITAL_BASED_OUTPATIENT_CLINIC_OR_DEPARTMENT_OTHER)
Admission: EM | Admit: 2012-01-28 | Discharge: 2012-01-28 | Disposition: A | Discharge status: Home / Self Care | Payer: Medicaid Other | Attending: Emergency Medicine | Admitting: Emergency Medicine

## 2012-01-28 DIAGNOSIS — B9789 Other viral agents as the cause of diseases classified elsewhere: Secondary | ICD-10-CM | POA: Insufficient documentation

## 2012-01-28 DIAGNOSIS — Z79899 Other long term (current) drug therapy: Secondary | ICD-10-CM | POA: Insufficient documentation

## 2012-01-28 DIAGNOSIS — F329 Major depressive disorder, single episode, unspecified: Secondary | ICD-10-CM | POA: Insufficient documentation

## 2012-01-28 DIAGNOSIS — R059 Cough, unspecified: Secondary | ICD-10-CM | POA: Insufficient documentation

## 2012-01-28 DIAGNOSIS — K219 Gastro-esophageal reflux disease without esophagitis: Secondary | ICD-10-CM | POA: Insufficient documentation

## 2012-01-28 DIAGNOSIS — R0982 Postnasal drip: Secondary | ICD-10-CM | POA: Insufficient documentation

## 2012-01-28 DIAGNOSIS — J3489 Other specified disorders of nose and nasal sinuses: Secondary | ICD-10-CM | POA: Insufficient documentation

## 2012-01-28 DIAGNOSIS — R05 Cough: Secondary | ICD-10-CM | POA: Insufficient documentation

## 2012-01-28 DIAGNOSIS — Z8739 Personal history of other diseases of the musculoskeletal system and connective tissue: Secondary | ICD-10-CM | POA: Insufficient documentation

## 2012-01-28 DIAGNOSIS — R5381 Other malaise: Secondary | ICD-10-CM | POA: Insufficient documentation

## 2012-01-28 DIAGNOSIS — F17200 Nicotine dependence, unspecified, uncomplicated: Secondary | ICD-10-CM | POA: Insufficient documentation

## 2012-01-28 DIAGNOSIS — J309 Allergic rhinitis, unspecified: Secondary | ICD-10-CM | POA: Insufficient documentation

## 2012-01-28 DIAGNOSIS — F3289 Other specified depressive episodes: Secondary | ICD-10-CM | POA: Insufficient documentation

## 2012-01-28 DIAGNOSIS — B349 Viral infection, unspecified: Secondary | ICD-10-CM

## 2012-01-28 DIAGNOSIS — E119 Type 2 diabetes mellitus without complications: Secondary | ICD-10-CM | POA: Insufficient documentation

## 2012-01-28 MED ORDER — OSELTAMIVIR PHOSPHATE 75 MG PO CAPS: 75.0000 mg | ORAL_CAPSULE | Freq: Two times a day (BID) | ORAL | Status: DC

## 2012-01-28 MED ORDER — BENZONATATE 200 MG PO CAPS: 200.0000 mg | ORAL_CAPSULE | Freq: Three times a day (TID) | ORAL | Status: DC | PRN

## 2012-01-28 NOTE — ED Provider Notes (Signed)
History     CSN: [id number]  Arrival date & time 01/28/12  0725   First MD Initiated Contact with Patient 01/28/12 [phone]      Chief Complaint  Patient presents with  . Headache  . Cough    (Consider location/radiation/quality/duration/timing/severity/associated sxs/prior treatment) HPI Comments: Patient presents with cough and congestion that started 2 days ago. She's had some running nose nasal congestion and she says it's moved down into her chest. She denies any shortness of breath. She denies any productive cough. She denies any fevers or chills. She denies any myalgias. She denies any nausea vomiting or diarrhea. She states her symptoms got worse this morning. With worsening cough. She used some Tussin cough syrup this morning without relief.  Patient is a 47 y.o. female presenting with headaches and cough.  Headache  Pertinent negatives include no fever, no shortness of breath, no nausea and no vomiting.  Cough Associated symptoms include headaches and rhinorrhea. Pertinent negatives include no chest pain, no chills and no shortness of breath.    Past Medical History  Diagnosis Date  . Seasonal allergies   . Diabetes mellitus     NIDDM  . Meniscus tear 04/2011    right knee  . Depression     currently on meds  . GERD (gastroesophageal reflux disease)     treats w/OTC meds    Past Surgical History  Procedure Date  . No past surgeries   . Knee arthroscopy 04/17/2011    Procedure: ARTHROSCOPY KNEE;  Surgeon: Sherri Rad, MD;  Location: [facility];  Service: Orthopedics;  Laterality: Right;  Removal of Plica and Medial Menical Debridement  . Esophagogastroduodenoscopy 01/09/2012    Procedure: ESOPHAGOGASTRODUODENOSCOPY (EGD);  Surgeon: Barrie Folk, MD;  Location: [facility];  Service: Endoscopy;  Laterality: N/A;    No family history on file.  History  Substance Use Topics  . Smoking status: Current Every Day Smoker -- 1.0 packs/day for 15 years    Types: Cigarettes  . Smokeless tobacco: Never Used     Comment: 5 cig./day  . Alcohol Use: No    OB History    Grav Para Term Preterm Abortions TAB SAB Ect Mult Living                  Review of Systems  Constitutional: Positive for fatigue. Negative for fever, chills and diaphoresis.  HENT: Positive for congestion, rhinorrhea and postnasal drip. Negative for sneezing.   Eyes: Negative.   Respiratory: Positive for cough. Negative for chest tightness and shortness of breath.   Cardiovascular: Negative for chest pain and leg swelling.  Gastrointestinal: Negative for nausea, vomiting, abdominal pain, diarrhea and blood in stool.  Genitourinary: Negative for frequency, hematuria, flank pain and difficulty urinating.  Musculoskeletal: Negative for back pain and arthralgias.  Skin: Negative for rash.  Neurological: Positive for headaches. Negative for dizziness, speech difficulty, weakness and numbness.    Allergies  Review of patient's allergies indicates no known allergies.  Home Medications   Current Outpatient Rx  Name  Route  Sig  Dispense  Refill  . ARIPIPRAZOLE 10 MG PO TABS   Oral   Take 1 tablet (10 mg total) by mouth daily.   30 tablet   0   . BENZONATATE 200 MG PO CAPS   Oral   Take 1 capsule (200 mg total) by mouth 3 (three) times daily as needed for cough.   20 capsule   0   . METFORMIN  HCL 500 MG PO TABS   Oral   Take 1 tablet (500 mg total) by mouth 2 (two) times daily with a meal.   60 tablet   0   . OSELTAMIVIR PHOSPHATE 75 MG PO CAPS   Oral   Take 1 capsule (75 mg total) by mouth every 12 (twelve) hours.   10 capsule   0   . PANTOPRAZOLE SODIUM 40 MG PO TBEC   Oral   Take 1 tablet (40 mg total) by mouth 2 (two) times daily.   30 tablet   0   . SERTRALINE HCL 50 MG PO TABS   Oral   Take 1 tablet (50 mg total) by mouth daily.   30 tablet   0   . TRAZODONE HCL 100 MG PO TABS   Oral   Take 1 tablet (100 mg total) by mouth at bedtime.    30 tablet   0     BP 122/79  Pulse 105  Temp 99.6 F (37.6 C) (Oral)  Resp 18  Ht 5\' 7"  (1.702 m)  Wt 226 lb (102.513 kg)  BMI 35.40 kg/m2  SpO2 94%  LMP 01/04/2012  Physical Exam  Constitutional: She is oriented to person, place, and time. She appears well-developed and well-nourished.  HENT:  Head: Normocephalic and atraumatic.  Right Ear: External ear normal.  Left Ear: External ear normal.  Mouth/Throat: Oropharynx is clear and moist.  Eyes: Pupils are equal, round, and reactive to light.  Neck: Normal range of motion. Neck supple.  Cardiovascular: Normal rate, regular rhythm and normal heart sounds.   Pulmonary/Chest: Effort normal and breath sounds normal. No respiratory distress. She has no wheezes. She has no rales. She exhibits no tenderness.  Abdominal: Soft. Bowel sounds are normal. There is no tenderness. There is no rebound and no guarding.  Musculoskeletal: Normal range of motion. She exhibits no edema.  Lymphadenopathy:    She has no cervical adenopathy.  Neurological: She is alert and oriented to person, place, and time.  Skin: Skin is warm and dry. No rash noted.  Psychiatric: She has a normal mood and affect.    ED Course  Procedures (including critical care time)  Results for orders placed during the hospital encounter of 01/11/12  GLUCOSE, CAPILLARY      Component Value Range   Glucose-Capillary 137 (*) 70 - 99 mg/dL  GLUCOSE, CAPILLARY      Component Value Range   Glucose-Capillary 137 (*) 70 - 99 mg/dL  GLUCOSE, CAPILLARY      Component Value Range   Glucose-Capillary 115 (*) 70 - 99 mg/dL  GLUCOSE, CAPILLARY      Component Value Range   Glucose-Capillary 111 (*) 70 - 99 mg/dL   Comment 1 Notify RN    GLUCOSE, CAPILLARY      Component Value Range   Glucose-Capillary 116 (*) 70 - 99 mg/dL  GLUCOSE, CAPILLARY      Component Value Range   Glucose-Capillary 105 (*) 70 - 99 mg/dL  GLUCOSE, CAPILLARY      Component Value Range    Glucose-Capillary 108 (*) 70 - 99 mg/dL  GLUCOSE, CAPILLARY      Component Value Range   Glucose-Capillary 119 (*) 70 - 99 mg/dL   Comment 1 Documented in Chart     Comment 2 Notify RN    GLUCOSE, CAPILLARY      Component Value Range   Glucose-Capillary 96  70 - 99 mg/dL   Comment 1 Notify RN  GLUCOSE, CAPILLARY      Component Value Range   Glucose-Capillary 97  70 - 99 mg/dL  GLUCOSE, CAPILLARY      Component Value Range   Glucose-Capillary 103 (*) 70 - 99 mg/dL   Comment 1 Notify RN    GLUCOSE, CAPILLARY      Component Value Range   Glucose-Capillary 119 (*) 70 - 99 mg/dL  GLUCOSE, CAPILLARY      Component Value Range   Glucose-Capillary 100 (*) 70 - 99 mg/dL  GLUCOSE, CAPILLARY      Component Value Range   Glucose-Capillary 87  70 - 99 mg/dL   Dg Chest 2 View  8/65/7846  *RADIOLOGY REPORT*  Clinical Data: Cough.  Fever.  Body aches.  CHEST - 2 VIEW  Comparison: 01/10/2012  Findings: Similar degree of airway thickening compared to prior exam. Cardiac and mediastinal contours appear unremarkable.  No pleural effusion with no discrete airspace opacity identified. Chronic old granulomatous disease, with calcified left hilar lymph nodes.  IMPRESSION:  1.  Stable airway thickening, query bronchitis or reactive airways disease.   Otherwise, no significant abnormality identified.   Original Report Authenticated By: Gaylyn Rong, M.D.    Dg Chest 2 View  01/11/2012  *RADIOLOGY REPORT*  Clinical Data: Cough.  Chest pain.  CHEST - 2 VIEW  Comparison: 01/08/2012.  Findings: Normal sized heart.  Clear lungs.  Mild to moderate diffuse peribronchial thickening with mild progression.  No airspace consolidation.  Unremarkable bones.  IMPRESSION: Mild to moderate bronchitic changes with mild progression.   Original Report Authenticated By: Beckie Salts, M.D.    Dg Abd Acute W/chest  01/08/2012  *RADIOLOGY REPORT*  Clinical Data: Vomiting, hematemesis  ACUTE ABDOMEN SERIES (ABDOMEN 2 VIEW &  CHEST 1 VIEW)  Comparison: Prior chest x-ray and rib series 07/01/2006  Findings: The lungs are clear.  No focal airspace consolidation, pulmonary edema or pleural effusion.  Cardiac and mediastinal contours within normal limits.  No suspicious pulmonary nodular opacities.  No acute osseous abnormality.  No evidence of pneumothorax or free air on the decubitus abdominal views.  Unremarkable bowel gas pattern on the supine view.  Gas noted throughout the colon to the level of the rectum.  No evidence of obstruction.  No acute osseous abnormality.  IMPRESSION: 1.  No acute cardiopulmonary disease 2.  No acute abdominal abnormality by conventional radiography   Original Report Authenticated By: Malachy Moan, M.D.       1. Viral syndrome       MDM  Pt well appearing with URI symptoms.  Low grade fever in ED.  Given diabetes hx, will offer tamiflu, also started on tessalon perles for cough.  Return if symptoms worsen.        Rolan Bucco, MD 01/28/12 [phone]

## 2012-01-28 NOTE — ED Notes (Signed)
Pt reports onset of headache and cough 2 days ago.

## 2012-01-28 NOTE — ED Notes (Signed)
Patient transported to X-ray 

## 2012-03-22 ENCOUNTER — Encounter (HOSPITAL_COMMUNITY): Payer: Self-pay | Admitting: Emergency Medicine

## 2012-03-22 ENCOUNTER — Emergency Department (HOSPITAL_COMMUNITY)
Admission: EM | Admit: 2012-03-22 | Discharge: 2012-03-22 | Disposition: A | Discharge status: Home / Self Care | Payer: Medicaid Other | Attending: Emergency Medicine | Admitting: Emergency Medicine

## 2012-03-22 DIAGNOSIS — K219 Gastro-esophageal reflux disease without esophagitis: Secondary | ICD-10-CM | POA: Insufficient documentation

## 2012-03-22 DIAGNOSIS — F329 Major depressive disorder, single episode, unspecified: Secondary | ICD-10-CM | POA: Insufficient documentation

## 2012-03-22 DIAGNOSIS — Z87828 Personal history of other (healed) physical injury and trauma: Secondary | ICD-10-CM | POA: Insufficient documentation

## 2012-03-22 DIAGNOSIS — Z79899 Other long term (current) drug therapy: Secondary | ICD-10-CM | POA: Insufficient documentation

## 2012-03-22 DIAGNOSIS — E119 Type 2 diabetes mellitus without complications: Secondary | ICD-10-CM | POA: Insufficient documentation

## 2012-03-22 DIAGNOSIS — F17200 Nicotine dependence, unspecified, uncomplicated: Secondary | ICD-10-CM | POA: Insufficient documentation

## 2012-03-22 DIAGNOSIS — R209 Unspecified disturbances of skin sensation: Secondary | ICD-10-CM | POA: Insufficient documentation

## 2012-03-22 DIAGNOSIS — F3289 Other specified depressive episodes: Secondary | ICD-10-CM | POA: Insufficient documentation

## 2012-03-22 DIAGNOSIS — R202 Paresthesia of skin: Secondary | ICD-10-CM

## 2012-03-22 DIAGNOSIS — F1010 Alcohol abuse, uncomplicated: Secondary | ICD-10-CM | POA: Insufficient documentation

## 2012-03-22 LAB — CBC
HCT: 42 % (ref 36.0–46.0)
Hemoglobin: 14.4 g/dL (ref 12.0–15.0)
MCH: 32.4 pg (ref 26.0–34.0)
MCHC: 34.3 g/dL (ref 30.0–36.0)
MCV: 94.4 fL (ref 78.0–100.0)
Platelets: 213 10*3/uL (ref 150–400)
RBC: 4.45 MIL/uL (ref 3.87–5.11)
RDW: 13.1 % (ref 11.5–15.5)
WBC: 13.7 10*3/uL — ABNORMAL HIGH (ref 4.0–10.5)

## 2012-03-22 LAB — RAPID URINE DRUG SCREEN, HOSP PERFORMED
Amphetamines: NOT DETECTED
Barbiturates: NOT DETECTED
Benzodiazepines: NOT DETECTED
Cocaine: NOT DETECTED
Opiates: NOT DETECTED
Tetrahydrocannabinol: NOT DETECTED

## 2012-03-22 LAB — COMPREHENSIVE METABOLIC PANEL
ALT: 25 U/L (ref 0–35)
AST: 28 U/L (ref 0–37)
Albumin: 3.2 g/dL — ABNORMAL LOW (ref 3.5–5.2)
Alkaline Phosphatase: 71 U/L (ref 39–117)
BUN: 7 mg/dL (ref 6–23)
CO2: 19 mEq/L (ref 19–32)
Calcium: 9.1 mg/dL (ref 8.4–10.5)
Chloride: 104 mEq/L (ref 96–112)
Creatinine, Ser: 0.82 mg/dL (ref 0.50–1.10)
GFR calc Af Amer: >90 mL/min (ref >90)
GFR calc non Af Amer: 86 mL/min — ABNORMAL LOW (ref >90)
Glucose, Bld: 131 mg/dL — ABNORMAL HIGH (ref 70–99)
Potassium: 4 mEq/L (ref 3.5–5.1)
Sodium: 136 mEq/L (ref 135–145)
Total Bilirubin: 0.2 mg/dL — ABNORMAL LOW (ref 0.3–1.2)
Total Protein: 7 g/dL (ref 6.0–8.3)

## 2012-03-22 LAB — POCT PREGNANCY, URINE: Preg Test, Ur: NEGATIVE

## 2012-03-22 LAB — ETHANOL: Alcohol, Ethyl (B): 217 mg/dL — ABNORMAL HIGH (ref 0–11)

## 2012-03-22 MED ORDER — IBUPROFEN 600 MG PO TABS: 600.0000 mg | ORAL_TABLET | Freq: Four times a day (QID) | ORAL | Status: DC | PRN

## 2012-03-22 NOTE — ED Provider Notes (Signed)
History     CSN: [id number]  Arrival date & time 03/22/12  1924   First MD Initiated Contact with Patient 03/22/12 1933      Chief Complaint  Patient presents with  . Alcohol Problem  . Depression  . Extremity Weakness    hand numbness     (Consider location/radiation/quality/duration/timing/severity/associated sxs/prior treatment) HPI Pt called EMS for episodic bl hand paresthesias this last episode starting 1 hour before presentation. Pt states mostly occurs when she is asleep. No focal weakness. Pt admits to drinking 3 40oz beers prior to presentation. She admits to depression and previous SI but denies current. Pt admits to alcohol abuse but denies wanting help with rehab.   Past Medical History  Diagnosis Date  . Seasonal allergies   . Diabetes mellitus     NIDDM  . Meniscus tear 04/2011    right knee  . Depression     currently on meds  . GERD (gastroesophageal reflux disease)     treats w/OTC meds    Past Surgical History  Procedure Laterality Date  . No past surgeries    . Knee arthroscopy  04/17/2011    Procedure: ARTHROSCOPY KNEE;  Surgeon: Sherri Rad, MD;  Location: [facility];  Service: Orthopedics;  Laterality: Right;  Removal of Plica and Medial Menical Debridement  . Esophagogastroduodenoscopy  01/09/2012    Procedure: ESOPHAGOGASTRODUODENOSCOPY (EGD);  Surgeon: Barrie Folk, MD;  Location: [facility];  Service: Endoscopy;  Laterality: N/A;    No family history on file.  History  Substance Use Topics  . Smoking status: Current Every Day Smoker -- 1.00 packs/day for 15 years    Types: Cigarettes  . Smokeless tobacco: Never Used     Comment: 5 cig./day  . Alcohol Use: No    OB History   Grav Para Term Preterm Abortions TAB SAB Ect Mult Living                  Review of Systems  Constitutional: Negative for fever and chills.  HENT: Negative for neck pain.   Respiratory: Negative for shortness of breath.   Cardiovascular:  Negative for chest pain.  Gastrointestinal: Negative for nausea, vomiting and abdominal pain.  Skin: Negative for rash and wound.  Neurological: Positive for numbness. Negative for dizziness, weakness and headaches.  Psychiatric/Behavioral: Negative for suicidal ideas and hallucinations.  All other systems reviewed and are negative.    Allergies  Review of patient's allergies indicates no known allergies.  Home Medications   Current Outpatient Rx  Name  Route  Sig  Dispense  Refill  . ARIPiprazole (ABILIFY) 10 MG tablet   Oral   Take 1 tablet (10 mg total) by mouth daily.   30 tablet   0   . metFORMIN (GLUCOPHAGE) 500 MG tablet   Oral   Take 1 tablet (500 mg total) by mouth 2 (two) times daily with a meal.   60 tablet   0   . pantoprazole (PROTONIX) 40 MG tablet   Oral   Take 1 tablet (40 mg total) by mouth 2 (two) times daily.   30 tablet   0   . sertraline (ZOLOFT) 50 MG tablet   Oral   Take 1 tablet (50 mg total) by mouth daily.   30 tablet   0   . traZODone (DESYREL) 100 MG tablet   Oral   Take 1 tablet (100 mg total) by mouth at bedtime.   30 tablet  0     BP 113/65  Pulse 83  Temp(Src) 98.6 F (37 C) (Oral)  Resp 20  SpO2 95%  LMP 02/27/2012  Physical Exam  Nursing note and vitals reviewed. Constitutional: She is oriented to person, place, and time. She appears well-developed and well-nourished. No distress.  Pt is drowsy with smell of EtOH on breath  HENT:  Head: Normocephalic and atraumatic.  Mouth/Throat: Oropharynx is clear and moist.  Eyes: EOM are normal. Pupils are equal, round, and reactive to light.  Neck: Normal range of motion. Neck supple.  No meningismus or midline posterior cervical ttp  Cardiovascular: Normal rate and regular rhythm.   Pulmonary/Chest: Effort normal and breath sounds normal. No respiratory distress. She has no wheezes. She has no rales.  Abdominal: Soft. Bowel sounds are normal. She exhibits no mass. There is  no tenderness. There is no rebound and no guarding.  Musculoskeletal: Normal range of motion. She exhibits no edema and no tenderness.  Neurological: She is oriented to person, place, and time.  Paresthesias to bl hands in stocking glove distribution exacerbated by tapping over radial nerve. Intrinsic muscle of hand grossly intact though limited by patient cooperation. Lower ext strength intact.   Skin: Skin is warm and dry. No rash noted. No erythema.    ED Course  Procedures (including critical care time)  Labs Reviewed  CBC - Abnormal; Notable for the following:    WBC 13.7 (*)    All other components within normal limits  COMPREHENSIVE METABOLIC PANEL - Abnormal; Notable for the following:    Glucose, Bld 131 (*)    Albumin 3.2 (*)    Total Bilirubin 0.2 (*)    GFR calc non Af Amer 86 (*)    All other components within normal limits  ETHANOL - Abnormal; Notable for the following:    Alcohol, Ethyl (B) 217 (*)    All other components within normal limits  URINE RAPID DRUG SCREEN (HOSP PERFORMED)  POCT PREGNANCY, URINE   No results found.   1. Paresthesias in right hand   2. Paresthesias in left hand       MDM   Pt is now more sober. Will d/c home with boyfriend. Given hand follow up for suspected carpal tunnel syndrome.        Loren Racer, MD 03/22/12 2214

## 2012-03-22 NOTE — ED Notes (Signed)
Bed:WA10<BR> Expected date:<BR> Expected time:<BR> Means of arrival:<BR> Comments:<BR> EMS

## 2012-03-22 NOTE — ED Notes (Signed)
As per EMS, pt sts she has been drinking all day, pt c/o hand numbness. Pt is requesting evaluation for depression and pregnancy test.

## 2012-04-28 ENCOUNTER — Other Ambulatory Visit: Payer: Self-pay | Admitting: Internal Medicine

## 2012-04-28 DIAGNOSIS — Z1231 Encounter for screening mammogram for malignant neoplasm of breast: Secondary | ICD-10-CM

## 2012-05-28 ENCOUNTER — Ambulatory Visit
Admission: RE | Admit: 2012-05-28 | Discharge: 2012-05-28 | Disposition: A | Discharge status: Home / Self Care | Payer: Medicaid Other | Source: Ambulatory Visit | Attending: Internal Medicine | Admitting: Internal Medicine

## 2012-05-28 DIAGNOSIS — Z1231 Encounter for screening mammogram for malignant neoplasm of breast: Secondary | ICD-10-CM

## 2012-09-12 ENCOUNTER — Encounter (HOSPITAL_COMMUNITY): Payer: Self-pay | Admitting: *Deleted

## 2012-09-12 ENCOUNTER — Emergency Department (HOSPITAL_COMMUNITY)
Admission: EM | Admit: 2012-09-12 | Discharge: 2012-09-12 | Disposition: A | Discharge status: Home / Self Care | Payer: Medicaid Other | Attending: Emergency Medicine | Admitting: Emergency Medicine

## 2012-09-12 ENCOUNTER — Emergency Department (HOSPITAL_COMMUNITY): Payer: Medicaid Other

## 2012-09-12 DIAGNOSIS — Z791 Long term (current) use of non-steroidal anti-inflammatories (NSAID): Secondary | ICD-10-CM | POA: Insufficient documentation

## 2012-09-12 DIAGNOSIS — Y929 Unspecified place or not applicable: Secondary | ICD-10-CM | POA: Insufficient documentation

## 2012-09-12 DIAGNOSIS — X500XXA Overexertion from strenuous movement or load, initial encounter: Secondary | ICD-10-CM | POA: Insufficient documentation

## 2012-09-12 DIAGNOSIS — F3289 Other specified depressive episodes: Secondary | ICD-10-CM | POA: Insufficient documentation

## 2012-09-12 DIAGNOSIS — F17200 Nicotine dependence, unspecified, uncomplicated: Secondary | ICD-10-CM | POA: Insufficient documentation

## 2012-09-12 DIAGNOSIS — F329 Major depressive disorder, single episode, unspecified: Secondary | ICD-10-CM | POA: Insufficient documentation

## 2012-09-12 DIAGNOSIS — K219 Gastro-esophageal reflux disease without esophagitis: Secondary | ICD-10-CM | POA: Insufficient documentation

## 2012-09-12 DIAGNOSIS — S93401A Sprain of unspecified ligament of right ankle, initial encounter: Secondary | ICD-10-CM

## 2012-09-12 DIAGNOSIS — Y9339 Activity, other involving climbing, rappelling and jumping off: Secondary | ICD-10-CM | POA: Insufficient documentation

## 2012-09-12 DIAGNOSIS — E119 Type 2 diabetes mellitus without complications: Secondary | ICD-10-CM

## 2012-09-12 DIAGNOSIS — Z79899 Other long term (current) drug therapy: Secondary | ICD-10-CM | POA: Insufficient documentation

## 2012-09-12 DIAGNOSIS — S93409A Sprain of unspecified ligament of unspecified ankle, initial encounter: Secondary | ICD-10-CM | POA: Insufficient documentation

## 2012-09-12 MED ORDER — OXYCODONE-ACETAMINOPHEN 5-325 MG PO TABS
2.0000 | ORAL_TABLET | Freq: Once | ORAL | Status: AC
  Administered 2012-09-12: 2 via ORAL
  Filled 2012-09-12: qty 2

## 2012-09-12 MED ORDER — NAPROXEN 500 MG PO TABS: 500.0000 mg | ORAL_TABLET | Freq: Two times a day (BID) | ORAL | Status: DC

## 2012-09-12 NOTE — ED Provider Notes (Signed)
CSN: [id number]     Arrival date & time 09/12/12  1445 History   This chart was scribed for non-physician practitioner Raymon Mutton, PA-C, working with Dione Booze, MD, by Yevette Edwards, ED Scribe. This patient was seen in room TR05C/TR05C and the patient's care was started at 4:03 PM. First MD Initiated Contact with Patient 09/12/12 1529     Chief Complaint  Patient presents with  . Ankle Injury    The history is provided by the patient. No language interpreter was used.   HPI Comments: Angel Mendez is a 47 y.o. female who presents to the Emergency Department complaining of acute, right ankle pain which occurred yesterday evening when her foot became stuck in a step as she was walking up the stairs. Patient reported that she was wearing high heels to drinking alcohol, patient reported that she was at her sister's anniversary party. She rates her pain as 10/10, and she describes the pain as "throbbing." She also states the pain intermittently "shoots" into her right foot. The pt states that ambulation and weight-bearing increase the pain. She has also experienced swelling to the ankle. She has attempted to mitigate the pain with elevation, with minimal relief. She has not used ice or pain medication for relief.  She denies any head trauma or LOC associated with the injury. She also denies any chest pain, SOB, numbness or loss of sensation to her right foot, calf pain. The pt has previously experienced a hairline fracture to her right foot.   Past Medical History  Diagnosis Date  . Seasonal allergies   . Diabetes mellitus     NIDDM  . Meniscus tear 04/2011    right knee  . Depression     currently on meds  . GERD (gastroesophageal reflux disease)     treats w/OTC meds   Past Surgical History  Procedure Laterality Date  . No past surgeries    . Knee arthroscopy  04/17/2011    Procedure: ARTHROSCOPY KNEE;  Surgeon: Sherri Rad, MD;  Location: [facility];   Service: Orthopedics;  Laterality: Right;  Removal of Plica and Medial Menical Debridement  . Esophagogastroduodenoscopy  01/09/2012    Procedure: ESOPHAGOGASTRODUODENOSCOPY (EGD);  Surgeon: Barrie Folk, MD;  Location: [facility];  Service: Endoscopy;  Laterality: N/A;   History reviewed. No pertinent family history. History  Substance Use Topics  . Smoking status: Current Every Day Smoker -- 1.00 packs/day for 15 years    Types: Cigarettes  . Smokeless tobacco: Never Used     Comment: 5 cig./day  . Alcohol Use: No   No OB history provided.  Review of Systems  Constitutional: Negative for fever and chills.  Respiratory: Negative for shortness of breath.   Cardiovascular: Negative for chest pain.  Gastrointestinal: Negative for nausea, vomiting and diarrhea.  Musculoskeletal: Positive for myalgias (Right ankle).  Neurological: Negative for syncope, weakness and numbness.  All other systems reviewed and are negative.   Allergies  Review of patient's allergies indicates no known allergies.  Home Medications   Current Outpatient Rx  Name  Route  Sig  Dispense  Refill  . ARIPiprazole (ABILIFY) 10 MG tablet   Oral   Take 1 tablet (10 mg total) by mouth daily.   30 tablet   0   . Ascorbic Acid (VITAMIN C) 100 MG tablet   Oral   Take 100 mg by mouth daily.         . metFORMIN (GLUCOPHAGE) 500 MG  tablet   Oral   Take 500 mg by mouth daily.         . pantoprazole (PROTONIX) 40 MG tablet   Oral   Take 40 mg by mouth daily as needed (for acid reflux).         . sertraline (ZOLOFT) 50 MG tablet   Oral   Take 1 tablet (50 mg total) by mouth daily.   30 tablet   0   . traZODone (DESYREL) 100 MG tablet   Oral   Take 1 tablet (100 mg total) by mouth at bedtime.   30 tablet   0   . naproxen (NAPROSYN) 500 MG tablet   Oral   Take 1 tablet (500 mg total) by mouth 2 (two) times daily.   30 tablet   0    Triage Vitals: BP 114/69  Pulse 87  Temp(Src) 98.3 F  (36.8 C) (Oral)  Resp 16  SpO2 97%  Physical Exam  Nursing note and vitals reviewed. Constitutional: She is oriented to person, place, and time. She appears well-developed and well-nourished. No distress.  HENT:  Head: Normocephalic and atraumatic.  Negative facial swelling, negative injuries noted to the face, negative head trauma  Eyes: Conjunctivae and EOM are normal. Pupils are equal, round, and reactive to light. Right eye exhibits no discharge. Left eye exhibits no discharge.  Neck: Normal range of motion. Neck supple.  Cardiovascular: Normal rate, regular rhythm and normal heart sounds.  Exam reveals no friction rub.   No murmur heard. Pulses:      Radial pulses are 2+ on the right side, and 2+ on the left side.       Dorsalis pedis pulses are 2+ on the right side, and 2+ on the left side.  Pulmonary/Chest: Effort normal and breath sounds normal. No respiratory distress. She has no wheezes. She has no rales.  Musculoskeletal: She exhibits tenderness.       Feet:  Swelling with mild ecchymosis noted to the lateral malleolus of the right ankle. Decreased range of motion secondary to pain. Pain upon passive motion of dorsiflexion, plantar flexion, inversion, eversion of the right ankle. Negative pain upon palpation to the calf. Patient is able to wiggle toes. Discomfort noted upon applying pressure to the foot.  Neurological: She is alert and oriented to person, place, and time. She exhibits normal muscle tone. Coordination normal.  Strength the decreased in the right foot secondary to pain Sensation intact with differentiation to sharp and dull touch  Skin: Skin is warm and dry. No rash noted. She is not diaphoretic. No erythema.  Psychiatric: She has a normal mood and affect. Her behavior is normal. Thought content normal.    ED Course  Procedures (including critical care time)  DIAGNOSTIC STUDIES: Oxygen Saturation is 97% on room air, normal by my interpretation.     COORDINATION OF CARE:  4:13 PM- Discussed treatment plan with patient, and the patient agreed to the plan.   Labs Review Labs Reviewed - No data to display Imaging Review Dg Ankle Complete Right  09/12/2012   *RADIOLOGY REPORT*  Clinical Data: Trauma with lateral pain.  RIGHT ANKLE - COMPLETE 3+ VIEW  Comparison: None.  Findings: Moderate lateral malleolar soft tissue swelling. No acute fracture or dislocation.  Base of fifth metatarsal and talar dome intact.  Small Achilles and calcaneal spurs.  IMPRESSION: Lateral soft tissue swelling only.   Original Report Authenticated By: Jeronimo Greaves, M.D.    MDM   1. Ankle  sprain, right, initial encounter   2. DM (diabetes mellitus)     I personally performed the services described in this documentation, which was scribed in my presence. The recorded information has been reviewed and is accurate.  Patient presenting to emergency department with right ankle pain that started this morning. Patient reported that she was drinking some alcohol at her sister's anniversary party last night, stated that she was walking up the stairs she landed on her right ankle in awkward manner leading to instant pain. Patient reported that she was wearing high heels at this point. Patient reported that when she woke up this morning the pain was a 10 out of 10, reported that she elevated the ankle while lying on the couch. Reported some numbness to the toes. Denied loss of sensation, head injury, loss of consciousness. Alert and oriented. Swelling and mild ecchymosis noted to the lateral malleolus of the right ankle. Pain upon palpation to the lateral malleolus and dorsal aspect of the right foot. Negative pain upon palpation to the toes. Patient is able to flex and extend toes well. Pain with passive motion to the right ankle. Pain with dorsiflexion, plantar flexion, inversion and eversion of the foot. Pain with applying pressure to the right foot. Sensation intact to right  foot. Pulses palpable. Strength decreased secondary to pain. Right ankle soft tissue swelling of lateral malleoli region identified with negative findings for fracture dislocation. Patient stable, afebrile. Suspicion to be ATFL sprain secondary to ankle injury. Pain controlled in ED setting. Patient placed in cam walker boot with crutches given for comfort. Discharge patient with small dose of anti-inflammatories. Discussed with patient to followup with orthopedics next week. Patient to rest and stay hydrated. Discussed with patient to avoid any strenuous or physical activity. Discussed with patient to continue monitor symptoms and if symptoms are to worsen or change to report back to emergency department - strict return instructions given. Patient agreed plan of care, understood, all questions answered.  Raymon Mutton, PA-C 09/13/12 0153

## 2012-09-12 NOTE — ED Notes (Signed)
Error in documation

## 2012-09-12 NOTE — ED Notes (Signed)
Pt in c/o right ankle injury that occurred last night while climbing some steps, increased pain with ambulation, swelling noted

## 2012-09-13 NOTE — ED Provider Notes (Signed)
Medical screening examination/treatment/procedure(s) were performed by non-physician practitioner and as supervising physician I was immediately available for consultation/collaboration.  Dione Booze, MD 09/13/12 [phone]

## 2012-12-18 ENCOUNTER — Encounter (HOSPITAL_COMMUNITY): Payer: Self-pay | Admitting: Emergency Medicine

## 2012-12-18 ENCOUNTER — Emergency Department (HOSPITAL_COMMUNITY)
Admission: EM | Admit: 2012-12-18 | Discharge: 2012-12-18 | Disposition: A | Discharge status: Home / Self Care | Payer: Medicaid Other | Attending: Emergency Medicine | Admitting: Emergency Medicine

## 2012-12-18 DIAGNOSIS — Z79899 Other long term (current) drug therapy: Secondary | ICD-10-CM | POA: Insufficient documentation

## 2012-12-18 DIAGNOSIS — Z8739 Personal history of other diseases of the musculoskeletal system and connective tissue: Secondary | ICD-10-CM | POA: Insufficient documentation

## 2012-12-18 DIAGNOSIS — F3289 Other specified depressive episodes: Secondary | ICD-10-CM | POA: Insufficient documentation

## 2012-12-18 DIAGNOSIS — E119 Type 2 diabetes mellitus without complications: Secondary | ICD-10-CM | POA: Insufficient documentation

## 2012-12-18 DIAGNOSIS — K219 Gastro-esophageal reflux disease without esophagitis: Secondary | ICD-10-CM | POA: Insufficient documentation

## 2012-12-18 DIAGNOSIS — F329 Major depressive disorder, single episode, unspecified: Secondary | ICD-10-CM | POA: Insufficient documentation

## 2012-12-18 DIAGNOSIS — F17200 Nicotine dependence, unspecified, uncomplicated: Secondary | ICD-10-CM | POA: Insufficient documentation

## 2012-12-18 MED ORDER — PANTOPRAZOLE SODIUM 20 MG PO TBEC
20.0000 mg | DELAYED_RELEASE_TABLET | Freq: Once | ORAL | Status: AC
  Administered 2012-12-18: 20 mg via ORAL
  Filled 2012-12-18: qty 1

## 2012-12-18 MED ORDER — PANTOPRAZOLE SODIUM 20 MG PO TBEC: 20.0000 mg | DELAYED_RELEASE_TABLET | Freq: Every day | ORAL | Status: DC

## 2012-12-18 NOTE — ED Provider Notes (Signed)
CSN: [id number]     Arrival date & time 12/18/12  1113 History   First MD Initiated Contact with Patient 12/18/12 1224     Chief Complaint  Patient presents with  . Gastrophageal Reflux    Patient is a 47 y.o. female presenting with GERD. The history is provided by the patient.  Gastrophageal Reflux This is a recurrent problem. The current episode started more than 1 week ago. The problem occurs daily. The problem has been gradually worsening. Associated symptoms include chest pain. Pertinent negatives include no abdominal pain and no shortness of breath. The symptoms are aggravated by eating. The symptoms are relieved by rest.  pt reports symptoms of esophageal reflux for months, she has not been taking her protonix She reports she will have chest burning while eating No SOB No fever    Past Medical History  Diagnosis Date  . Seasonal allergies   . Diabetes mellitus     NIDDM  . Meniscus tear 04/2011    right knee  . Depression     currently on meds  . GERD (gastroesophageal reflux disease)     treats w/OTC meds   Past Surgical History  Procedure Laterality Date  . No past surgeries    . Knee arthroscopy  04/17/2011    Procedure: ARTHROSCOPY KNEE;  Surgeon: Sherri Rad, MD;  Location: [facility];  Service: Orthopedics;  Laterality: Right;  Removal of Plica and Medial Menical Debridement  . Esophagogastroduodenoscopy  01/09/2012    Procedure: ESOPHAGOGASTRODUODENOSCOPY (EGD);  Surgeon: Barrie Folk, MD;  Location: [facility];  Service: Endoscopy;  Laterality: N/A;   No family history on file. History  Substance Use Topics  . Smoking status: Current Every Day Smoker -- 1.00 packs/day for 15 years    Types: Cigarettes  . Smokeless tobacco: Never Used     Comment: 5 cig./day  . Alcohol Use: No   OB History   Grav Para Term Preterm Abortions TAB SAB Ect Mult Living                 Review of Systems  Constitutional: Negative for fever.  Respiratory:  Negative for shortness of breath.   Cardiovascular: Positive for chest pain.  Gastrointestinal: Negative for vomiting and abdominal pain.  Neurological: Negative for weakness.  All other systems reviewed and are negative.    Allergies  Review of patient's allergies indicates no known allergies.  Home Medications   Current Outpatient Rx  Name  Route  Sig  Dispense  Refill  . metFORMIN (GLUCOPHAGE) 500 MG tablet   Oral   Take 500 mg by mouth daily.         . naproxen (NAPROSYN) 500 MG tablet   Oral   Take 500 mg by mouth 2 (two) times daily with a meal.         . traZODone (DESYREL) 100 MG tablet   Oral   Take 100 mg by mouth at bedtime as needed for sleep.          BP 122/77  Pulse 75  Temp(Src) 98.6 F (37 C) (Oral)  Resp 14  Ht 5\' 7"  (1.702 m)  Wt 216 lb (97.977 kg)  BMI 33.82 kg/m2  SpO2 98%  LMP 12/17/2012 Physical Exam CONSTITUTIONAL: Well developed/well nourished, pt watching TV, no distress HEAD: Normocephalic/atraumatic EYES: EOMI/PERRL ENMT: Mucous membranes moist NECK: supple no meningeal signs SPINE:entire spine nontender CV: S1/S2 noted, no murmurs/rubs/gallops noted LUNGS: Lungs are clear to auscultation bilaterally, no  apparent distress ABDOMEN: soft, nontender, no rebound or guarding GU:no cva tenderness NEURO: Pt is awake/alert, moves all extremitiesx4 EXTREMITIES: pulses normal, full ROM SKIN: warm, color normal PSYCH: no abnormalities of mood noted  ED Course  Procedures (including critical care time) Labs Review Labs Reviewed - No data to display Imaging Review No results found.  EKG Interpretation   None     will restart meds, and encourage f/u She reports symptoms for month I doubt ACS or other acute cardiopulmonary process  MDM  No diagnosis found. Nursing notes including past medical history and social history reviewed and considered in documentation    Date: 12/18/2012  Rate: 72  Rhythm: normal sinus rhythm  QRS  Axis: normal  Intervals: normal  ST/T Wave abnormalities: nonspecific T wave changes  Conduction Disutrbances:none  Narrative Interpretation:   Old EKG Reviewed: unchanged from 03/22/12    Joya Gaskins, MD 12/18/12 1252

## 2012-12-18 NOTE — ED Notes (Signed)
Pt with hx of GERD.  Pt states she has not had meds in 5 months.  GERD has gotten progressively worse over the last 3 months.  Pt initially stated that her boyfriend woke up this morning with blisters in his mouth and on his extremities.  Pt wants to be checked for that (?).

## 2013-03-27 ENCOUNTER — Emergency Department (HOSPITAL_COMMUNITY): Payer: Medicaid Other

## 2013-03-27 ENCOUNTER — Emergency Department (HOSPITAL_COMMUNITY)
Admission: EM | Admit: 2013-03-27 | Discharge: 2013-03-28 | Disposition: A | Discharge status: Home / Self Care | Payer: Medicaid Other | Attending: Emergency Medicine | Admitting: Emergency Medicine

## 2013-03-27 ENCOUNTER — Encounter (HOSPITAL_COMMUNITY): Payer: Self-pay | Admitting: Emergency Medicine

## 2013-03-27 DIAGNOSIS — S82209A Unspecified fracture of shaft of unspecified tibia, initial encounter for closed fracture: Secondary | ICD-10-CM | POA: Insufficient documentation

## 2013-03-27 DIAGNOSIS — S82831A Other fracture of upper and lower end of right fibula, initial encounter for closed fracture: Secondary | ICD-10-CM

## 2013-03-27 DIAGNOSIS — F329 Major depressive disorder, single episode, unspecified: Secondary | ICD-10-CM | POA: Insufficient documentation

## 2013-03-27 DIAGNOSIS — R296 Repeated falls: Secondary | ICD-10-CM | POA: Insufficient documentation

## 2013-03-27 DIAGNOSIS — S82301A Unspecified fracture of lower end of right tibia, initial encounter for closed fracture: Secondary | ICD-10-CM

## 2013-03-27 DIAGNOSIS — Z79899 Other long term (current) drug therapy: Secondary | ICD-10-CM | POA: Insufficient documentation

## 2013-03-27 DIAGNOSIS — E119 Type 2 diabetes mellitus without complications: Secondary | ICD-10-CM | POA: Insufficient documentation

## 2013-03-27 DIAGNOSIS — F1010 Alcohol abuse, uncomplicated: Secondary | ICD-10-CM | POA: Insufficient documentation

## 2013-03-27 DIAGNOSIS — S82409A Unspecified fracture of shaft of unspecified fibula, initial encounter for closed fracture: Principal | ICD-10-CM

## 2013-03-27 DIAGNOSIS — F17200 Nicotine dependence, unspecified, uncomplicated: Secondary | ICD-10-CM | POA: Insufficient documentation

## 2013-03-27 DIAGNOSIS — Y939 Activity, unspecified: Secondary | ICD-10-CM | POA: Insufficient documentation

## 2013-03-27 DIAGNOSIS — F3289 Other specified depressive episodes: Secondary | ICD-10-CM | POA: Insufficient documentation

## 2013-03-27 DIAGNOSIS — Y9289 Other specified places as the place of occurrence of the external cause: Secondary | ICD-10-CM | POA: Insufficient documentation

## 2013-03-27 DIAGNOSIS — K219 Gastro-esophageal reflux disease without esophagitis: Secondary | ICD-10-CM | POA: Insufficient documentation

## 2013-03-27 MED ORDER — ONDANSETRON HCL 4 MG/2ML IJ SOLN
4.0000 mg | Freq: Once | INTRAMUSCULAR | Status: AC
  Administered 2013-03-28: 4 mg via INTRAVENOUS
  Filled 2013-03-27: qty 2

## 2013-03-27 MED ORDER — HYDROMORPHONE HCL PF 1 MG/ML IJ SOLN
1.0000 mg | Freq: Once | INTRAMUSCULAR | Status: AC
  Administered 2013-03-28: 1 mg via INTRAVENOUS
  Filled 2013-03-27: qty 1

## 2013-03-27 MED ORDER — SODIUM CHLORIDE 0.9 % IV BOLUS (SEPSIS)
1000.0000 mL | Freq: Once | INTRAVENOUS | Status: AC
Start: 2013-03-27 — End: 2013-03-28
  Administered 2013-03-28: 1000 mL via INTRAVENOUS

## 2013-03-27 NOTE — ED Notes (Signed)
[facility],per bedside report; c/o ankle deformity from a fall, report of alcohol intoxication 5, 40oz and 2 shots of vodka, denies substance abuse, Hx of diabetes controlled by metformin, pt AOx4, slightly lethargic at this time. Pt reports pain 8/10, ad has received 150 mcgs of Fentanyl en route. Pt in mild pain induced distress.

## 2013-03-27 NOTE — ED Notes (Signed)
Bed: [id number] Expected date: 03/27/13 Expected time: 10:26 PM Means of arrival: Ambulance Comments: 48 yo F  Fall, ankle deformity

## 2013-03-27 NOTE — ED Notes (Signed)
MD at bedside. 

## 2013-03-28 ENCOUNTER — Emergency Department (HOSPITAL_COMMUNITY): Payer: Medicaid Other

## 2013-03-28 LAB — I-STAT CHEM 8, ED
BUN: 5 mg/dL — ABNORMAL LOW (ref 6–23)
Calcium, Ion: 1.11 mmol/L — ABNORMAL LOW (ref 1.12–1.23)
Chloride: 104 mEq/L (ref 96–112)
Creatinine, Ser: 1.2 mg/dL — ABNORMAL HIGH (ref 0.50–1.10)
Glucose, Bld: 134 mg/dL — ABNORMAL HIGH (ref 70–99)
HCT: 43 % (ref 36.0–46.0)
Hemoglobin: 14.6 g/dL (ref 12.0–15.0)
Potassium: 3.9 mEq/L (ref 3.7–5.3)
Sodium: 143 mEq/L (ref 137–147)
TCO2: 23 mmol/L (ref 0–100)

## 2013-03-28 LAB — CBC
HCT: 39.4 % (ref 36.0–46.0)
Hemoglobin: 13.5 g/dL (ref 12.0–15.0)
MCH: 32.2 pg (ref 26.0–34.0)
MCHC: 34.3 g/dL (ref 30.0–36.0)
MCV: 94 fL (ref 78.0–100.0)
Platelets: 200 10*3/uL (ref 150–400)
RBC: 4.19 MIL/uL (ref 3.87–5.11)
RDW: 13 % (ref 11.5–15.5)
WBC: 11.5 10*3/uL — ABNORMAL HIGH (ref 4.0–10.5)

## 2013-03-28 MED ORDER — HYDROMORPHONE HCL PF 1 MG/ML IJ SOLN
1.0000 mg | Freq: Once | INTRAMUSCULAR | Status: AC
  Administered 2013-03-28: 1 mg via INTRAVENOUS
  Filled 2013-03-28: qty 1

## 2013-03-28 MED ORDER — OXYCODONE-ACETAMINOPHEN 5-325 MG PO TABS: 2.0000 | ORAL_TABLET | ORAL | Status: DC | PRN

## 2013-03-28 NOTE — ED Provider Notes (Signed)
CSN: [id number]     Arrival date & time 03/27/13  2237 History   First MD Initiated Contact with Patient 03/27/13 2307     Chief Complaint  Patient presents with  . Ankle Injury  . Alcohol Intoxication     (Consider location/radiation/quality/duration/timing/severity/associated sxs/prior Treatment) HPI History provided by patient. Admits to alcohol use tonight, had gone to bed and woke up to use the bathroom and as she stepped out of bed injured her right ankle. EMS was called for severe pain with ankle deformity. She denies any other trauma or injury. No LOC. No neck pain. No upper extremity injury. No associated weakness or numbness. Pain is sharp in quality, severe and worse with any kind of movement. She has history of knee arthroscopically, but cannot recall orthopedic physician's name does not followup with him.   Past Medical History  Diagnosis Date  . Seasonal allergies   . Diabetes mellitus     NIDDM  . Meniscus tear 04/2011    right knee  . Depression     currently on meds  . GERD (gastroesophageal reflux disease)     treats w/OTC meds   Past Surgical History  Procedure Laterality Date  . No past surgeries    . Knee arthroscopy  04/17/2011    Procedure: ARTHROSCOPY KNEE;  Surgeon: Sherri RadPaul A Bednarz, MD;  Location: [facility];  Service: Orthopedics;  Laterality: Right;  Removal of Plica and Medial Menical Debridement  . Esophagogastroduodenoscopy  01/09/2012    Procedure: ESOPHAGOGASTRODUODENOSCOPY (EGD);  Surgeon: Barrie FolkJohn C Hayes, MD;  [facility];  Service: Endoscopy;  Laterality: N/A;   No family history on file. History  Substance Use Topics  . Smoking status: Current Every Day Smoker -- 1.00 packs/day for 15 years    Types: Cigarettes  . Smokeless tobacco: Never Used     Comment: 5 cig./day  . Alcohol Use: Yes   OB History   Grav Para Term Preterm Abortions TAB SAB Ect Mult Living                 Review of Systems  Constitutional:  Negative for fever and chills.  Eyes: Negative for visual disturbance.  Respiratory: Negative for shortness of breath.   Cardiovascular: Negative for chest pain.  Gastrointestinal: Negative for vomiting and abdominal pain.  Genitourinary: Negative for flank pain.  Musculoskeletal: Negative for back pain, neck pain and neck stiffness.  Skin: Positive for wound. Negative for rash.  Neurological: Negative for weakness, numbness and headaches.  All other systems reviewed and are negative.      Allergies  Review of patient's allergies indicates no known allergies.  Home Medications   Current Outpatient Rx  Name  Route  Sig  Dispense  Refill  . pantoprazole (PROTONIX) 40 MG tablet   Oral   Take 40 mg by mouth daily.         . traZODone (DESYREL) 100 MG tablet   Oral   Take 100 mg by mouth at bedtime as needed for sleep.          Pulse 83  Temp(Src) 98.3 F (36.8 C) (Oral)  Resp 16  SpO2 98%  LMP 03/06/2013 Physical Exam  Constitutional: She is oriented to person, place, and time. She appears well-developed and well-nourished.  HENT:  Head: Normocephalic and atraumatic.  Eyes: EOM are normal. Pupils are equal, round, and reactive to light.  Neck: Neck supple.  No cervical spine tenderness or deformity  Cardiovascular: Normal rate, regular  rhythm and intact distal pulses.   Pulmonary/Chest: Effort normal and breath sounds normal. No respiratory distress. She exhibits no tenderness.  Abdominal: Soft. She exhibits no distension.  Musculoskeletal:  No upper extremity areas of tenderness or deformity. Right lower extremity with obvious ankle deformity, swelling and tenderness. Skin intact throughout. Equal dorsalis pedis pulses. Distal motor and sensorium to light touch intact. There is mild proximal fibula tenderness without deformity. Pelvis stable.   Neurological: She is alert and oriented to person, place, and time.  Skin: Skin is warm and dry.    ED Course   Reduction of fracture Date/Time: 03/28/2013 1:02 AM Performed by: Sunnie Nielsen Authorized by: Sunnie Nielsen Consent: Verbal consent obtained. Risks and benefits: risks, benefits and alternatives were discussed Consent given by: patient Patient understanding: patient states understanding of the procedure being performed Patient consent: the patient's understanding of the procedure matches consent given Procedure consent: procedure consent matches procedure scheduled Required items: required blood products, implants, devices, and special equipment available Patient identity confirmed: verbally with patient Time out: Immediately prior to procedure a "time out" was called to verify the correct patient, procedure, equipment, support staff and site/side marked as required. Patient tolerance: Patient tolerated the procedure well with no immediate complications.  SPLINT APPLICATION Date/Time: 03/28/2013 1:02 AM Performed by: Sunnie Nielsen Authorized by: Sunnie Nielsen Consent: Verbal consent obtained. Risks and benefits: risks, benefits and alternatives were discussed Consent given by: patient Patient understanding: patient states understanding of the procedure being performed Patient consent: the patient's understanding of the procedure matches consent given Procedure consent: procedure consent matches procedure scheduled Required items: required blood products, implants, devices, and special equipment available Patient identity confirmed: verbally with patient Location details: right leg Splint type: ankle stirrup Supplies used: cotton padding and Ortho-Glass Post-procedure: The splinted body part was neurovascularly unchanged following the procedure. Patient tolerance: Patient tolerated the procedure well with no immediate complications. Comments: Posterior and stirrup placed   (including critical care time) Labs Review Labs Reviewed  CBC - Abnormal; Notable for the following:    WBC  11.5 (*)    All other components within normal limits  I-STAT CHEM 8, ED   Imaging Review Dg Chest 1 View  03/28/2013   CLINICAL DATA:  Ankle deformity, ankle fracture dislocation.  EXAM: CHEST - 1 VIEW  COMPARISON:  DG CHEST 2 VIEW dated 01/28/2012  FINDINGS: Normal cardiac silhouette. Mild central venous pulmonary congestion. No effusion, infiltrate, or pneumothorax.  IMPRESSION: Mild central venous pulmonary congestion.   Electronically Signed   By: Genevive Bi M.D.   On: 03/28/2013 00:38   Dg Tibia/fibula Right  03/28/2013   CLINICAL DATA:  Fall, leg deformity  EXAM: RIGHT TIBIA AND FIBULA - 2 VIEW  COMPARISON:  None.  FINDINGS: There is a fracture of the distal tibia-fibula with dislocation. No evidence of proximal tibia or fibular fracture.  IMPRESSION: No proximal tibia or fibular fracture.  Distal fracture dislocation.   Electronically Signed   By: Genevive Bi M.D.   On: 03/28/2013 00:18   Dg Ankle Complete Right  03/28/2013   CLINICAL DATA:  Fall, ankle deformity  EXAM: RIGHT ANKLE - COMPLETE 3+ VIEW  COMPARISON:  None.  FINDINGS: There is a fracture of the medial malleolus. There is a fracture of the distal fibula with lateral angulation and override. There is dislocation of the tibiotalar joint with the talus subluxed laterally in relation to the tibia.  IMPRESSION: Fracture of the distal tibia and fibula with tibiotalar dislocation.  Electronically Signed   By: Genevive Bi M.D.   On: 03/28/2013 00:21   IV Dilaudid, IV zofran, IVFs 1:37 AM d/w Graves - reviewed xrays. Plan discharge home with splint, crutches, pain control and followup Monday in the office. Plan operative repair likely Wednesday or Friday. Patient notified and agreeable with plan.  Discharged home with prescription for Percocet, splint and fracture precautions.   MDM   Diagnosis: Right distal tib-fib fracture with ankle dislocation  IV narcotics pain control. IV fluids provided. Labs obtained and  reviewed as above. X-rays obtained and reviewed Fracture dislocation reduction performed as above and postreduction films obtained and reviewed with good realignment Orthopedic consult  Vital signs and nursing notes reviewed and considered    Sunnie Nielsen, MD 03/28/13 [phone]

## 2013-03-28 NOTE — ED Notes (Signed)
Splint applied by Ortho Tech and Dr. Theodoro Kalataptiz.

## 2013-03-28 NOTE — Discharge Instructions (Signed)
Ankle Fracture °A fracture is a break in the bone. A cast or splint is used to protect and keep your injured bone from moving.  °HOME CARE INSTRUCTIONS  °· Use your crutches as directed. °· To lessen the swelling, keep the injured leg elevated while sitting or lying down. °· Apply ice to the injury for 15-20 minutes, 03-04 times per day while awake for 2 days. Put the ice in a plastic bag and place a thin towel between the bag of ice and your cast. °· If you have a plaster or fiberglass cast: °· Do not try to scratch the skin under the cast using sharp or pointed objects. °· Check the skin around the cast every day. You may put lotion on any red or sore areas. °· Keep your cast dry and clean. °· If you have a plaster splint: °· Wear the splint as directed. °· You may loosen the elastic around the splint if your toes become numb, tingle, or turn cold or blue. °· Do not put pressure on any part of your cast or splint; it may break. Rest your cast only on a pillow the first 24 hours until it is fully hardened. °· Your cast or splint can be protected during bathing with a plastic bag. Do not lower the cast or splint into water. °· Take medications as directed by your caregiver. Only take over-the-counter or prescription medicines for pain, discomfort, or fever as directed by your caregiver. °· Do not drive a vehicle until your caregiver specifically tells you it is safe to do so. °· If your caregiver has given you a follow-up appointment, it is very important to keep that appointment. Not keeping the appointment could result in a chronic or permanent injury, pain, and disability. If there is any problem keeping the appointment, you must call back to this facility for assistance. °SEEK IMMEDIATE MEDICAL CARE IF:  °· Your cast gets damaged or breaks. °· You have continued severe pain or more swelling than you did before the cast was put on. °· Your skin or toenails below the injury turn blue or gray, or feel cold or  numb. °· There is a bad smell or new stains and/or purulent (pus like) drainage coming from under the cast. °If you do not have a window in your cast for observing the wound, a discharge or minor bleeding may show up as a stain on the outside of your cast. Report these findings to your caregiver. °MAKE SURE YOU:  °· Understand these instructions. °· Will watch your condition. °· Will get help right away if you are not doing well or get worse. °Document Released: 12/22/1999 Document Revised: 03/18/2011 Document Reviewed: 07/23/2012 °[employer]® Patient Information ©2014 [employer]. ° °Cast or Splint Care °Casts and splints support injured limbs and keep bones from moving while they heal. It is important to care for your cast or splint at home.   °HOME CARE INSTRUCTIONS °· Keep the cast or splint uncovered during the drying period. It can take 24 to 48 hours to dry if it is made of plaster. A fiberglass cast will dry in less than 1 hour. °· Do not rest the cast on anything harder than a pillow for the first 24 hours. °· Do not put weight on your injured limb or apply pressure to the cast until your health care provider gives you permission. °· Keep the cast or splint dry. Wet casts or splints can lose their shape and may not support the limb   as well. A wet cast that has lost its shape can also create harmful pressure on your skin when it dries. Also, wet skin can become infected. °· Cover the cast or splint with a plastic bag when bathing or when out in the rain or snow. If the cast is on the trunk of the body, take sponge baths until the cast is removed. °· If your cast does become wet, dry it with a towel or a blow dryer on the cool setting only. °· Keep your cast or splint clean. Soiled casts may be wiped with a moistened cloth. °· Do not place any hard or soft foreign objects under your cast or splint, such as cotton, toilet paper, lotion, or powder. °· Do not try to scratch the skin under the cast with any  object. The object could get stuck inside the cast. Also, scratching could lead to an infection. If itching is a problem, use a blow dryer on a cool setting to relieve discomfort. °· Do not trim or cut your cast or remove padding from inside of it. °· Exercise all joints next to the injury that are not immobilized by the cast or splint. For example, if you have a long leg cast, exercise the hip joint and toes. If you have an arm cast or splint, exercise the shoulder, elbow, thumb, and fingers. °· Elevate your injured arm or leg on 1 or 2 pillows for the first 1 to 3 days to decrease swelling and pain. It is best if you can comfortably elevate your cast so it is higher than your heart. °SEEK MEDICAL CARE IF:  °· Your cast or splint cracks. °· Your cast or splint is too tight or too loose. °· You have unbearable itching inside the cast. °· Your cast becomes wet or develops a soft spot or area. °· You have a bad smell coming from inside your cast. °· You get an object stuck under your cast. °· Your skin around the cast becomes red or raw. °· You have new pain or worsening pain after the cast has been applied. °SEEK IMMEDIATE MEDICAL CARE IF:  °· You have fluid leaking through the cast. °· You are unable to move your fingers or toes. °· You have discolored (blue or white), cool, painful, or very swollen fingers or toes beyond the cast. °· You have tingling or numbness around the injured area. °· You have severe pain or pressure under the cast. °· You have any difficulty with your breathing or have shortness of breath. °· You have chest pain. °Document Released: 12/22/1999 Document Revised: 10/14/2012 Document Reviewed: 07/02/2012 °ExitCare® Patient Information ©2014 [employer]. ° °

## 2013-03-31 ENCOUNTER — Telehealth (HOSPITAL_COMMUNITY): Payer: Self-pay

## 2013-03-31 NOTE — ED Notes (Signed)
Pt calling regarding referral to Orthopedic MD in [place].  Chart reviewed pt was given incorrect name and contact info for ortho on call.  Pt given Angel Mendez and should have been Angel Mendez in [place].  Contact info provided.

## 2013-09-19 ENCOUNTER — Encounter (HOSPITAL_COMMUNITY): Payer: Self-pay | Admitting: Emergency Medicine

## 2013-09-19 ENCOUNTER — Emergency Department (HOSPITAL_COMMUNITY): Payer: Medicaid Other

## 2013-09-19 ENCOUNTER — Emergency Department (HOSPITAL_COMMUNITY)
Admission: EM | Admit: 2013-09-19 | Discharge: 2013-09-19 | Disposition: A | Discharge status: Home / Self Care | Payer: Medicaid Other | Attending: Emergency Medicine | Admitting: Emergency Medicine

## 2013-09-19 DIAGNOSIS — S8990XA Unspecified injury of unspecified lower leg, initial encounter: Secondary | ICD-10-CM | POA: Insufficient documentation

## 2013-09-19 DIAGNOSIS — S99919A Unspecified injury of unspecified ankle, initial encounter: Secondary | ICD-10-CM | POA: Diagnosis present

## 2013-09-19 DIAGNOSIS — E119 Type 2 diabetes mellitus without complications: Secondary | ICD-10-CM | POA: Insufficient documentation

## 2013-09-19 DIAGNOSIS — Y9383 Activity, rough housing and horseplay: Secondary | ICD-10-CM | POA: Insufficient documentation

## 2013-09-19 DIAGNOSIS — F3289 Other specified depressive episodes: Secondary | ICD-10-CM | POA: Insufficient documentation

## 2013-09-19 DIAGNOSIS — K219 Gastro-esophageal reflux disease without esophagitis: Secondary | ICD-10-CM | POA: Diagnosis not present

## 2013-09-19 DIAGNOSIS — F329 Major depressive disorder, single episode, unspecified: Secondary | ICD-10-CM | POA: Insufficient documentation

## 2013-09-19 DIAGNOSIS — S82843A Displaced bimalleolar fracture of unspecified lower leg, initial encounter for closed fracture: Secondary | ICD-10-CM | POA: Diagnosis not present

## 2013-09-19 DIAGNOSIS — S99929A Unspecified injury of unspecified foot, initial encounter: Secondary | ICD-10-CM

## 2013-09-19 DIAGNOSIS — F17200 Nicotine dependence, unspecified, uncomplicated: Secondary | ICD-10-CM | POA: Insufficient documentation

## 2013-09-19 DIAGNOSIS — S82842A Displaced bimalleolar fracture of left lower leg, initial encounter for closed fracture: Secondary | ICD-10-CM

## 2013-09-19 DIAGNOSIS — Y92009 Unspecified place in unspecified non-institutional (private) residence as the place of occurrence of the external cause: Secondary | ICD-10-CM | POA: Diagnosis not present

## 2013-09-19 DIAGNOSIS — X500XXA Overexertion from strenuous movement or load, initial encounter: Secondary | ICD-10-CM | POA: Insufficient documentation

## 2013-09-19 DIAGNOSIS — Z79899 Other long term (current) drug therapy: Secondary | ICD-10-CM | POA: Insufficient documentation

## 2013-09-19 MED ORDER — KETAMINE HCL 10 MG/ML IJ SOLN
200.0000 mg | Freq: Once | INTRAMUSCULAR | Status: AC
  Administered 2013-09-19: 100 mg via INTRAVENOUS
  Filled 2013-09-19: qty 20

## 2013-09-19 MED ORDER — HYDROCODONE-ACETAMINOPHEN 5-325 MG PO TABS
2.0000 | ORAL_TABLET | Freq: Once | ORAL | Status: AC
  Administered 2013-09-19: 2 via ORAL
  Filled 2013-09-19: qty 2

## 2013-09-19 MED ORDER — MORPHINE SULFATE 4 MG/ML IJ SOLN
4.0000 mg | Freq: Once | INTRAMUSCULAR | Status: AC
  Administered 2013-09-19: 4 mg via INTRAVENOUS
  Filled 2013-09-19: qty 1

## 2013-09-19 MED ORDER — HYDROCODONE-ACETAMINOPHEN 5-325 MG PO TABS: 2.0000 | ORAL_TABLET | Freq: Two times a day (BID) | ORAL | Status: DC | PRN

## 2013-09-19 NOTE — Discharge Instructions (Signed)
Bimalleolar Fracture, Ankle, Adult, Undisplaced Ms. Angel Mendez, you were seen today for ankle pain.  Your xray shows a fracture.  Your fracture was re-aligned and splinted.  Follow up with orthopaedic surgery on Tuesday for possible discussion of surgery.  Return sooner for any worsening pain, swelling, or numbness.  Thank you. You have two fractures (break in bone) in the lower bones of your leg that help to make up your ankle. These fractures are in the bone you feel as the bump on the outside of your ankle (fibula) and the bone that you feel as the bump on the inside of your ankle (tibia). Your fractures are not displaced. This means the bones are in their normal position and should give a good result if they heal in that position. Sometimes an open reduction and internal fixation with screws or pins may be used if the fracture seems unstable or if the position of the bone fragments (break apart) later is lost. Even with the best of care and perfect results this ankle may be more prone to be arthritic later. These fractures are easily diagnosed with X-rays. TREATMENT  A short-leg cast is then applied from your toes to below your knee. This is generally left in place for about 5 to 6 weeks, during which time it is followed by your caregiver and X-rays may be taken to make sure the bones stay in place. HOME CARE INSTRUCTIONS   Do not drive a vehicle until your caregiver specifically tells you it is safe to do so.  When home following surgery, apply ice to the area of injury for 15-20 minutes, 03-04 times per day while awake, for 2 days. Put the ice in a plastic bag and place a thin towel between the bag of ice and your cast.  If you have a plaster or fiberglass cast:  Do not try to scratch the skin under the cast using sharp or pointed objects.  Check the skin around the cast every day. You may put lotion on any red or sore areas.  Keep your cast dry and clean.  Do not put pressure on any part of  your cast or splint until it is fully hardened.  Your cast or splint can be protected during bathing with a plastic bag. Do not lower the cast or splint into water.  Use crutches as directed. These are not fractures to be taken lightly! If these bones become displaced and get out of position, it may eventually lead to arthritis and disability for the rest of your life. Problems often follow even the best of care. Follow the directions of your caregiver.  Only take over-the-counter or prescription medicines for pain, discomfort, or fever as directed by your caregiver. SEEK MEDICAL CARE IF:   Pain is becoming worse rather than better or if pain is uncontrolled with medications.  You have increased swelling, redness, or numbness in the foot. SEEK IMMEDIATE MEDICAL CARE IF:   Your skin or nails below the injury turn blue or gray, or feel cold or numb.  You develop severe pain under the cast or in your foot. Follow all instructions given to you by your caregiver, make and keep follow-up appointments, and use crutches as directed. MAKE SURE YOU:   Understand these instructions.  Will watch your condition.  Will get help right away if you are not doing well or get worse. Document Released: 09/15/2001 Document Revised: 03/18/2011 Document Reviewed: 07/29/2007 [facility] Patient Information 2015 [place], [place]. This information is not  intended to replace advice given to you by your health care provider. Make sure you discuss any questions you have with your health care provider. ° °

## 2013-09-19 NOTE — ED Notes (Signed)
Pt placed on 2L 02, pt's 02 sats reading 90% on RA. EDP informed.

## 2013-09-19 NOTE — ED Provider Notes (Signed)
CSN: [id number]     Arrival date & time 09/19/13  0405 History   First MD Initiated Contact with Patient 09/19/13 0416     Chief Complaint  Patient presents with  . Ankle Injury     (Consider location/radiation/quality/duration/timing/severity/associated sxs/prior Treatment) HPI  Angel Mendez is a 48 y.o. female with no significant past medical history who was playing around with her significant other at home when she rolled her ankle and heard a snap. She is concerned that she has an ankle fracture on the left. Patient admits to drinking alcohol tonight as well. She denies any other injuries. Patient does believe she is safe at home and denies being abused by the significant other. She has no head injury no LOC.  10 Systems reviewed and are negative for acute change except as noted in the HPI.     Past Medical History  Diagnosis Date  . Seasonal allergies   . Diabetes mellitus     NIDDM  . Meniscus tear 04/2011    right knee  . Depression     currently on meds  . GERD (gastroesophageal reflux disease)     treats w/OTC meds   Past Surgical History  Procedure Laterality Date  . No past surgeries    . Knee arthroscopy  04/17/2011    Procedure: ARTHROSCOPY KNEE;  Surgeon: Sherri Rad, MD;  Location: [facility];  Service: Orthopedics;  Laterality: Right;  Removal of Plica and Medial Menical Debridement  . Esophagogastroduodenoscopy  01/09/2012    Procedure: ESOPHAGOGASTRODUODENOSCOPY (EGD);  Surgeon: Barrie Folk, MD;  Location: [facility];  Service: Endoscopy;  Laterality: N/A;  . Ankle surgery     No family history on file. History  Substance Use Topics  . Smoking status: Current Every Day Smoker -- 1.00 packs/day for 15 years    Types: Cigarettes  . Smokeless tobacco: Never Used     Comment: 5 cig./day  . Alcohol Use: Yes   OB History   Grav Para Term Preterm Abortions TAB SAB Ect Mult Living                 Review of  Systems    Allergies  Review of patient's allergies indicates no known allergies.  Home Medications   Prior to Admission medications   Medication Sig Start Date End Date Taking? Authorizing Provider  ARIPiprazole (ABILIFY) 10 MG tablet Take 10 mg by mouth daily.   Yes Historical Provider, MD  Ascorbic Acid (VITAMIN C PO) Take 1 tablet by mouth daily.   Yes Historical Provider, MD  metFORMIN (GLUCOPHAGE) 500 MG tablet Take 500 mg by mouth daily with breakfast.   Yes Historical Provider, MD  pantoprazole (PROTONIX) 40 MG tablet Take 40 mg by mouth daily.   Yes Historical Provider, MD  HYDROcodone-acetaminophen (NORCO/VICODIN) 5-325 MG per tablet Take 2 tablets by mouth 2 (two) times daily as needed for moderate pain or severe pain. 09/19/13   Tomasita Crumble, MD   BP 105/74  Pulse 73  Temp(Src) 98 F (36.7 C) (Oral)  Resp 19  Ht  (1.702 m)  Wt 226 lb (102.513 kg)  BMI 35.39 kg/m2  SpO2 98%  LMP 09/03/2013 Physical Exam  Nursing note and vitals reviewed. Constitutional: She is oriented to person, place, and time. She appears well-developed and well-nourished. No distress.  HENT:  Head: Normocephalic and atraumatic.  Nose: Nose normal.  Mouth/Throat: Oropharynx is clear and moist. No oropharyngeal exudate.  Eyes: Conjunctivae and EOM  are normal. Pupils are equal, round, and reactive to light. No scleral icterus.  Neck: Normal range of motion. Neck supple. No JVD present. No tracheal deviation present. No thyromegaly present.  Cardiovascular: Normal rate, regular rhythm and normal heart sounds.  Exam reveals no gallop and no friction rub.   No murmur heard. Pulmonary/Chest: Effort normal and breath sounds normal. No respiratory distress. She has no wheezes. She exhibits no tenderness.  Abdominal: Soft. Bowel sounds are normal. She exhibits no distension and no mass. There is no tenderness. There is no rebound and no guarding.  Musculoskeletal: Normal range of motion. She exhibits  no edema and no tenderness.  Left ankle with obvious deformity and swelling. There are good pulses distally in the DP and PT. Patient has normal sensation of her foot.  Lymphadenopathy:    She has no cervical adenopathy.  Neurological: She is alert and oriented to person, place, and time.  Skin: Skin is warm and dry. No rash noted. She is not diaphoretic. No erythema. No pallor.    ED Course  Procedures (including critical care time) Labs Review Labs Reviewed - No data to display  Imaging Review Dg Tibia/fibula Left  09/19/2013   CLINICAL DATA:  Ankle fracture. Evaluate remainder of the tibia and fibula.  EXAM: LEFT TIBIA AND FIBULA - 2 VIEW  COMPARISON:  09/19/2013  FINDINGS: Mildly comminuted distal fibular fracture is again noted.  There is no evidence of proximal or mid tibial/ fibular abnormalities.  Minimal tricompartmental degenerative changes in the knee are present.  IMPRESSION: No evidence of proximal or mid tibial/ fibular abnormality.  Distal fibular fracture again noted.   Electronically Signed   By: Laveda Abbe M.D.   On: 09/19/2013 08:00   Dg Ankle Complete Left  09/19/2013   CLINICAL DATA:  Status post fall; left ankle injury, with medial ankle pain.  EXAM: LEFT ANKLE COMPLETE - 3+ VIEW  COMPARISON:  Left ankle radiographs performed 05/07/2006  FINDINGS: There is a comminuted fracture involving the distal fibular diaphysis, with mild lateral displacement. A mildly displaced medial malleolar fracture is also seen. The posterior malleolus appears intact. There is mild medial widening of the ankle mortise.  The interosseous space appears grossly intact. The subtalar joint is grossly unremarkable in appearance.  Diffuse soft tissue swelling is noted about the ankle, more prominent medially.  IMPRESSION: 1. Comminuted fracture involving the distal fibular diaphysis, with mild lateral displacement. Mildly displaced medial malleolar fracture also seen. 2. Mild medial widening of the ankle  mortise.   Electronically Signed   By: Roanna Raider M.D.   On: 09/19/2013 06:22     EKG Interpretation None      MDM   Final diagnoses:  Ankle fracture, bimalleolar, closed, left, initial encounter    Patient presents emergency department a concern for left ankle pain. X-ray does reveal a bimalleolar fracture. Patient was sedated reduced and splinted in the emergency department. I spoke with Dr.Xu who recommends the patient to followup in clinic on Tuesday to discuss possible surgery. Compartment syndrome return precautions were given. Patient was initially hypotensive on arrival. Patient was asymptomatic from this with systolic in the 80s. Could be due to her heavy alcohol drinking. Patient is unsure of her normal blood pressure. This has otherwise been stable throughout her ED course and asymptomatic from a blood pressure standpoint. She is safe for discharge.  Upon repeat assessment the patient she has a normal pulse and normal sensation of the extremity.  Reduction of  dislocation Date/Time: 9:14 AM Performed by: Tomasita Crumble Authorized byTomasita Crumble Consent: Verbal consent obtained. Risks and benefits: risks, benefits and alternatives were discussed Consent given by: patient Required items: required blood products, implants, devices, and special equipment available Time out: Immediately prior to procedure a "time out" was called to verify the correct patient, procedure, equipment, support staff and site/side marked as required.  Patient sedated: Ketamine  Vitals: Vital signs were monitored during sedation. Patient tolerance: Patient tolerated the procedure well with no immediate complications. Joint: Left ankle Reduction technique: Traction counter traction with manipulation    Procedural sedation Performed by: Tomasita Crumble Consent: Verbal consent obtained. Risks and benefits: risks, benefits and alternatives were discussed Required items: required blood products,  implants, devices, and special equipment available Patient identity confirmed: arm band and provided demographic data Time out: Immediately prior to procedure a "time out" was called to verify the correct patient, procedure, equipment, support staff and site/side marked as required.  Sedation type: moderate (conscious) sedation NPO time confirmed and considedered  Sedatives: KETAMINE   Physician Time at Bedside: 30 minutes  Vitals: Vital signs were monitored during sedation. Cardiac Monitor, pulse oximeter Patient tolerance: Patient tolerated the procedure well with no immediate complications. Comments: Pt with uneventful recovered. Returned to pre-procedural sedation baseline    Tomasita Crumble, MD 09/19/13 [phone]

## 2013-09-19 NOTE — ED Notes (Signed)
Pharmacy called to inquire about ketamine.

## 2013-09-19 NOTE — Progress Notes (Signed)
Orthopedic Tech Progress Note Patient Details:  Angel Mendez 1965/09/24 [id number] Reduction of LE ankle dislocation. Posterior SLS with stirrup applied to LLE. Ortho Devices Type of Ortho Device: Ace wrap;Stirrup splint;Post (short leg) splint Ortho Device/Splint Location: LLE Ortho Device/Splint Interventions: Application   Asia R Thompson 09/19/2013, 9:08 AM

## 2013-09-19 NOTE — ED Notes (Signed)
Called to inquire about pt's xray, backed up.

## 2013-09-19 NOTE — ED Notes (Signed)
Pt stated that uncle is going to come pick her up after church.

## 2013-09-19 NOTE — Progress Notes (Signed)
CSW Intern Tonette Bihari confirmed that pt had a ride home and did not require CSW assistance with transportation.

## 2013-09-19 NOTE — ED Notes (Signed)
Per EMS pt was wrestling with her boyfriend at her apartment about two hours ago when she heard her left ankle snap. Pt's dorsalis pedis 2+, pt has obvious deformity in her left ankle. Pt's BP 90/50 upon arrival to department.

## 2013-09-19 NOTE — ED Notes (Signed)
MD at bedside. 

## 2013-09-29 ENCOUNTER — Other Ambulatory Visit (HOSPITAL_COMMUNITY): Payer: Self-pay | Admitting: Orthopaedic Surgery

## 2013-09-29 ENCOUNTER — Ambulatory Visit: Payer: Self-pay | Admitting: Orthopaedic Surgery

## 2013-09-30 ENCOUNTER — Encounter (HOSPITAL_COMMUNITY): Payer: Self-pay

## 2013-09-30 ENCOUNTER — Encounter (HOSPITAL_COMMUNITY)
Admission: RE | Admit: 2013-09-30 | Discharge: 2013-09-30 | Disposition: A | Discharge status: Home / Self Care | Payer: Medicaid Other | Source: Ambulatory Visit | Attending: Orthopaedic Surgery | Admitting: Orthopaedic Surgery

## 2013-09-30 ENCOUNTER — Other Ambulatory Visit (HOSPITAL_COMMUNITY): Payer: Self-pay

## 2013-09-30 DIAGNOSIS — R296 Repeated falls: Secondary | ICD-10-CM | POA: Diagnosis not present

## 2013-09-30 DIAGNOSIS — K219 Gastro-esophageal reflux disease without esophagitis: Secondary | ICD-10-CM | POA: Diagnosis not present

## 2013-09-30 DIAGNOSIS — F3289 Other specified depressive episodes: Secondary | ICD-10-CM | POA: Diagnosis not present

## 2013-09-30 DIAGNOSIS — F17200 Nicotine dependence, unspecified, uncomplicated: Secondary | ICD-10-CM | POA: Diagnosis not present

## 2013-09-30 DIAGNOSIS — S82843A Displaced bimalleolar fracture of unspecified lower leg, initial encounter for closed fracture: Secondary | ICD-10-CM | POA: Diagnosis not present

## 2013-09-30 DIAGNOSIS — F329 Major depressive disorder, single episode, unspecified: Secondary | ICD-10-CM | POA: Diagnosis not present

## 2013-09-30 DIAGNOSIS — E119 Type 2 diabetes mellitus without complications: Secondary | ICD-10-CM | POA: Diagnosis not present

## 2013-09-30 HISTORY — DX: Pneumonia, unspecified organism: J18.9

## 2013-09-30 HISTORY — DX: Presence of spectacles and contact lenses: Z97.3

## 2013-09-30 LAB — CBC
HCT: 38.9 % (ref 36.0–46.0)
Hemoglobin: 13.4 g/dL (ref 12.0–15.0)
MCH: 32 pg (ref 26.0–34.0)
MCHC: 34.4 g/dL (ref 30.0–36.0)
MCV: 92.8 fL (ref 78.0–100.0)
Platelets: 306 10*3/uL (ref 150–400)
RBC: 4.19 MIL/uL (ref 3.87–5.11)
RDW: 13.1 % (ref 11.5–15.5)
WBC: 10 10*3/uL (ref 4.0–10.5)

## 2013-09-30 LAB — BASIC METABOLIC PANEL
Anion gap: 14 (ref 5–15)
BUN: 9 mg/dL (ref 6–23)
CO2: 19 mEq/L (ref 19–32)
Calcium: 9 mg/dL (ref 8.4–10.5)
Chloride: 101 mEq/L (ref 96–112)
Creatinine, Ser: 0.63 mg/dL (ref 0.50–1.10)
GFR calc Af Amer: >90 mL/min (ref >90)
GFR calc non Af Amer: >90 mL/min (ref >90)
Glucose, Bld: 164 mg/dL — ABNORMAL HIGH (ref 70–99)
Potassium: 3.7 mEq/L (ref 3.7–5.3)
Sodium: 134 mEq/L — ABNORMAL LOW (ref 137–147)

## 2013-09-30 LAB — HCG, SERUM, QUALITATIVE: Preg, Serum: NEGATIVE

## 2013-09-30 MED ORDER — CEFAZOLIN SODIUM-DEXTROSE 2-3 GM-% IV SOLR
2.0000 g | INTRAVENOUS | Status: AC
  Administered 2013-10-01: 2 g via INTRAVENOUS
  Filled 2013-09-30: qty 50

## 2013-09-30 NOTE — Pre-Procedure Instructions (Signed)
ADDISEN CHAPPELLE  09/30/2013   Your procedure is scheduled on: Friday, October 01, 2013  Report to [facility] Admitting at 5:30 AM.  Call this number if you have problems the morning of surgery: [phone]   Remember:   Do not eat food or drink liquids after midnight.   Take these medicines the morning of surgery with A SIP OF WATER: ARIPiprazole (ABILIFY), pantoprazole (PROTONIX), if needed: HYDROcodone for pain   Do not wear jewelry, make-up or nail polish.  Do not wear lotions, powders, or perfumes. You may not wear deodorant.  Do not shave 48 hours prior to surgery.   Do not bring valuables to the hospital.  [facility] is not responsible for any belongings or valuables.               Contacts, dentures or bridgework may not be worn into surgery.  Leave suitcase in the car. After surgery it may be brought to your room.  For patients admitted to the hospital, discharge time is determined by your treatment team.               Patients discharged the day of surgery will not be allowed to drive home.  Name and phone number of your driver:   Special Instructions:  Special Instructions:Special Instructions: [facility] - Preparing for Surgery  Before surgery, you can play an important role.  Because skin is not sterile, your skin needs to be as free of germs as possible.  You can reduce the number of germs on you skin by washing with CHG (chlorahexidine gluconate) soap before surgery.  CHG is an antiseptic cleaner which kills germs and bonds with the skin to continue killing germs even after washing.  Please DO NOT use if you have an allergy to CHG or antibacterial soaps.  If your skin becomes reddened/irritated stop using the CHG and inform your nurse when you arrive at Short Stay.  Do not shave (including legs and underarms) for at least 48 hours prior to the first CHG shower.  You may shave your face.  Please follow these instructions carefully:   1.   Shower with CHG Soap the night before surgery and the morning of Surgery.  2.  If you choose to wash your hair, wash your hair first as usual with your normal shampoo.  3.  After you shampoo, rinse your hair and body thoroughly to remove the Shampoo.  4.  Use CHG as you would any other liquid soap.  You can apply chg directly  to the skin and wash gently with scrungie or a clean washcloth.  5.  Apply the CHG Soap to your body ONLY FROM THE NECK DOWN.  Do not use on open wounds or open sores.  Avoid contact with your eyes, ears, mouth and genitals (private parts).  Wash genitals (private parts) with your normal soap.  6.  Wash thoroughly, paying special attention to the area where your surgery will be performed.  7.  Thoroughly rinse your body with warm water from the neck down.  8.  DO NOT shower/wash with your normal soap after using and rinsing off the CHG Soap.  9.  Pat yourself dry with a clean towel.            10.  Wear clean pajamas.            11.  Place clean sheets on your bed the night of your first shower and do not sleep  with pets.  Day of Surgery  Do not apply any lotions/deodorants the morning of surgery.  Please wear clean clothes to the hospital/surgery center.   Please read over the following fact sheets that you were given: Pain Booklet, Coughing and Deep Breathing and Surgical Site Infection Prevention

## 2013-09-30 NOTE — Progress Notes (Signed)
Pt denies SOB, chest pain, and being under the care of a cardiologist. Pt denies having a stress test, echo and cardiac cath. Pt made aware to stop taking Aspirin, vitamins, and herbal medications. Do not take any NSAIDs ie: Ibuprofen, Advil, Naproxen or any medication containing Aspirin.

## 2013-10-01 ENCOUNTER — Encounter (HOSPITAL_COMMUNITY): Payer: Medicaid Other | Admitting: Anesthesiology

## 2013-10-01 ENCOUNTER — Ambulatory Visit (HOSPITAL_COMMUNITY): Payer: Medicaid Other | Admitting: Anesthesiology

## 2013-10-01 ENCOUNTER — Encounter (HOSPITAL_COMMUNITY)
Admission: RE | Disposition: A | Discharge status: Home / Self Care | Payer: Self-pay | Source: Ambulatory Visit | Attending: Orthopaedic Surgery

## 2013-10-01 ENCOUNTER — Ambulatory Visit (HOSPITAL_COMMUNITY): Payer: Medicaid Other

## 2013-10-01 ENCOUNTER — Ambulatory Visit (HOSPITAL_COMMUNITY)
Admission: RE | Admit: 2013-10-01 | Discharge: 2013-10-03 | Disposition: A | Discharge status: Home / Self Care | Payer: Medicaid Other | Source: Ambulatory Visit | Attending: Orthopaedic Surgery | Admitting: Orthopaedic Surgery

## 2013-10-01 ENCOUNTER — Encounter (HOSPITAL_COMMUNITY): Payer: Self-pay | Admitting: *Deleted

## 2013-10-01 DIAGNOSIS — F3289 Other specified depressive episodes: Secondary | ICD-10-CM | POA: Insufficient documentation

## 2013-10-01 DIAGNOSIS — E119 Type 2 diabetes mellitus without complications: Secondary | ICD-10-CM | POA: Insufficient documentation

## 2013-10-01 DIAGNOSIS — K219 Gastro-esophageal reflux disease without esophagitis: Secondary | ICD-10-CM | POA: Insufficient documentation

## 2013-10-01 DIAGNOSIS — S82842A Displaced bimalleolar fracture of left lower leg, initial encounter for closed fracture: Secondary | ICD-10-CM

## 2013-10-01 DIAGNOSIS — F329 Major depressive disorder, single episode, unspecified: Secondary | ICD-10-CM | POA: Insufficient documentation

## 2013-10-01 DIAGNOSIS — R296 Repeated falls: Secondary | ICD-10-CM | POA: Insufficient documentation

## 2013-10-01 DIAGNOSIS — S82843A Displaced bimalleolar fracture of unspecified lower leg, initial encounter for closed fracture: Secondary | ICD-10-CM | POA: Diagnosis present

## 2013-10-01 DIAGNOSIS — F17200 Nicotine dependence, unspecified, uncomplicated: Secondary | ICD-10-CM | POA: Insufficient documentation

## 2013-10-01 HISTORY — PX: ORIF ANKLE FRACTURE: SHX5408

## 2013-10-01 LAB — GLUCOSE, CAPILLARY
Glucose-Capillary: 128 mg/dL — ABNORMAL HIGH (ref 70–99)
Glucose-Capillary: 159 mg/dL — ABNORMAL HIGH (ref 70–99)
Glucose-Capillary: 171 mg/dL — ABNORMAL HIGH (ref 70–99)
Glucose-Capillary: 125 mg/dL — ABNORMAL HIGH (ref 70–99)
Glucose-Capillary: 122 mg/dL — ABNORMAL HIGH (ref 70–99)

## 2013-10-01 SURGERY — OPEN REDUCTION INTERNAL FIXATION (ORIF) ANKLE FRACTURE
Anesthesia: General | Site: Ankle | Laterality: Left

## 2013-10-01 MED ORDER — METHOCARBAMOL 1000 MG/10ML IJ SOLN
500.0000 mg | Freq: Four times a day (QID) | INTRAVENOUS | Status: DC | PRN
  Filled 2013-10-01: qty 5

## 2013-10-01 MED ORDER — SENNOSIDES-DOCUSATE SODIUM 8.6-50 MG PO TABS: 1.0000 | ORAL_TABLET | Freq: Every evening | ORAL | Status: DC | PRN

## 2013-10-01 MED ORDER — NEOSTIGMINE METHYLSULFATE 10 MG/10ML IV SOLN
INTRAVENOUS | Status: AC
  Filled 2013-10-01: qty 1

## 2013-10-01 MED ORDER — SUCCINYLCHOLINE CHLORIDE 20 MG/ML IJ SOLN
INTRAMUSCULAR | Status: DC | PRN
  Administered 2013-10-01: 80 mg via INTRAVENOUS

## 2013-10-01 MED ORDER — HYDROCODONE-ACETAMINOPHEN 5-325 MG PO TABS
2.0000 | ORAL_TABLET | Freq: Two times a day (BID) | ORAL | Status: DC | PRN
  Filled 2013-10-01 (×2): qty 2

## 2013-10-01 MED ORDER — ARIPIPRAZOLE 10 MG PO TABS
10.0000 mg | ORAL_TABLET | Freq: Every day | ORAL | Status: DC
  Administered 2013-10-01 – 2013-10-03 (×3): 10 mg via ORAL
  Filled 2013-10-01 (×4): qty 1

## 2013-10-01 MED ORDER — OXYCODONE HCL 5 MG PO TABS
5.0000 mg | ORAL_TABLET | ORAL | Status: DC | PRN
  Administered 2013-10-01 – 2013-10-03 (×5): 15 mg via ORAL
  Filled 2013-10-01 (×5): qty 3

## 2013-10-01 MED ORDER — INSULIN ASPART 100 UNIT/ML ~~LOC~~ SOLN
0.0000 [IU] | Freq: Three times a day (TID) | SUBCUTANEOUS | Status: DC
  Administered 2013-10-01: 3 [IU] via SUBCUTANEOUS
  Administered 2013-10-01 – 2013-10-02 (×2): 2 [IU] via SUBCUTANEOUS
  Administered 2013-10-02: 3 [IU] via SUBCUTANEOUS
  Administered 2013-10-02 – 2013-10-03 (×2): 2 [IU] via SUBCUTANEOUS

## 2013-10-01 MED ORDER — BUPIVACAINE HCL (PF) 0.25 % IJ SOLN
INTRAMUSCULAR | Status: DC | PRN
Start: 2013-10-01 — End: 2013-10-01

## 2013-10-01 MED ORDER — LIDOCAINE HCL (CARDIAC) 20 MG/ML IV SOLN
INTRAVENOUS | Status: DC | PRN
  Administered 2013-10-01: 60 mg via INTRATRACHEAL

## 2013-10-01 MED ORDER — GLYCOPYRROLATE 0.2 MG/ML IJ SOLN
INTRAMUSCULAR | Status: DC | PRN
  Administered 2013-10-01: 0.4 mg via INTRAVENOUS

## 2013-10-01 MED ORDER — INSULIN ASPART 100 UNIT/ML ~~LOC~~ SOLN: 0.0000 [IU] | Freq: Every day | SUBCUTANEOUS | Status: DC

## 2013-10-01 MED ORDER — PANTOPRAZOLE SODIUM 40 MG PO TBEC
40.0000 mg | DELAYED_RELEASE_TABLET | Freq: Every day | ORAL | Status: DC
  Administered 2013-10-02 – 2013-10-03 (×2): 40 mg via ORAL
  Filled 2013-10-01 (×2): qty 1

## 2013-10-01 MED ORDER — ONDANSETRON HCL 4 MG/2ML IJ SOLN
INTRAMUSCULAR | Status: DC | PRN
  Administered 2013-10-01 (×2): 4 mg via INTRAVENOUS

## 2013-10-01 MED ORDER — NEOSTIGMINE METHYLSULFATE 10 MG/10ML IV SOLN
INTRAVENOUS | Status: DC | PRN
  Administered 2013-10-01: 2 mg via INTRAVENOUS

## 2013-10-01 MED ORDER — ONDANSETRON HCL 4 MG/2ML IJ SOLN: 4.0000 mg | Freq: Once | INTRAMUSCULAR | Status: DC | PRN

## 2013-10-01 MED ORDER — FENTANYL CITRATE 0.05 MG/ML IJ SOLN
INTRAMUSCULAR | Status: DC | PRN
  Administered 2013-10-01: 50 ug via INTRAVENOUS
  Administered 2013-10-01 (×2): 100 ug via INTRAVENOUS
  Administered 2013-10-01 (×2): 50 ug via INTRAVENOUS
  Administered 2013-10-01: 100 ug via INTRAVENOUS
  Administered 2013-10-01: 50 ug via INTRAVENOUS

## 2013-10-01 MED ORDER — METFORMIN HCL 500 MG PO TABS
500.0000 mg | ORAL_TABLET | Freq: Every day | ORAL | Status: DC
  Administered 2013-10-02 – 2013-10-03 (×2): 500 mg via ORAL
  Filled 2013-10-01 (×3): qty 1

## 2013-10-01 MED ORDER — LIDOCAINE HCL (CARDIAC) 20 MG/ML IV SOLN
INTRAVENOUS | Status: DC | PRN
  Administered 2013-10-01: 40 mg via INTRAVENOUS

## 2013-10-01 MED ORDER — MAGNESIUM CITRATE PO SOLN: 1.0000 | Freq: Once | ORAL | Status: AC | PRN

## 2013-10-01 MED ORDER — ONDANSETRON HCL 4 MG/2ML IJ SOLN
INTRAMUSCULAR | Status: AC
  Filled 2013-10-01: qty 2

## 2013-10-01 MED ORDER — POLYETHYLENE GLYCOL 3350 17 G PO PACK: 17.0000 g | PACK | Freq: Every day | ORAL | Status: DC | PRN

## 2013-10-01 MED ORDER — ASPIRIN EC 325 MG PO TBEC: 325.0000 mg | DELAYED_RELEASE_TABLET | Freq: Two times a day (BID) | ORAL | Status: DC

## 2013-10-01 MED ORDER — METOCLOPRAMIDE HCL 5 MG/ML IJ SOLN: 5.0000 mg | Freq: Three times a day (TID) | INTRAMUSCULAR | Status: DC | PRN

## 2013-10-01 MED ORDER — SODIUM CHLORIDE 0.9 % IV SOLN
INTRAVENOUS | Status: DC
  Administered 2013-10-02: 04:00:00 via INTRAVENOUS

## 2013-10-01 MED ORDER — MORPHINE SULFATE 2 MG/ML IJ SOLN: 1.0000 mg | INTRAMUSCULAR | Status: DC | PRN

## 2013-10-01 MED ORDER — CEFAZOLIN SODIUM-DEXTROSE 2-3 GM-% IV SOLR
2.0000 g | Freq: Four times a day (QID) | INTRAVENOUS | Status: AC
  Administered 2013-10-01 (×3): 2 g via INTRAVENOUS
  Filled 2013-10-01 (×3): qty 50

## 2013-10-01 MED ORDER — HYDROMORPHONE HCL 1 MG/ML IJ SOLN
0.2500 mg | INTRAMUSCULAR | Status: DC | PRN
  Administered 2013-10-01 (×4): 0.5 mg via INTRAVENOUS

## 2013-10-01 MED ORDER — ONDANSETRON HCL 4 MG/2ML IJ SOLN: 4.0000 mg | Freq: Four times a day (QID) | INTRAMUSCULAR | Status: DC | PRN

## 2013-10-01 MED ORDER — LIDOCAINE HCL (CARDIAC) 20 MG/ML IV SOLN
INTRAVENOUS | Status: AC
  Filled 2013-10-01: qty 5

## 2013-10-01 MED ORDER — METHOCARBAMOL 500 MG PO TABS
500.0000 mg | ORAL_TABLET | Freq: Four times a day (QID) | ORAL | Status: DC | PRN
  Administered 2013-10-01 – 2013-10-03 (×3): 500 mg via ORAL
  Filled 2013-10-01 (×5): qty 1

## 2013-10-01 MED ORDER — ROCURONIUM BROMIDE 50 MG/5ML IV SOLN
INTRAVENOUS | Status: AC
  Filled 2013-10-01: qty 1

## 2013-10-01 MED ORDER — HYDROMORPHONE HCL 1 MG/ML IJ SOLN
INTRAMUSCULAR | Status: AC
  Filled 2013-10-01: qty 1

## 2013-10-01 MED ORDER — KETOROLAC TROMETHAMINE 30 MG/ML IJ SOLN
30.0000 mg | Freq: Four times a day (QID) | INTRAMUSCULAR | Status: AC | PRN
  Filled 2013-10-01 (×2): qty 1

## 2013-10-01 MED ORDER — ASPIRIN EC 325 MG PO TBEC
325.0000 mg | DELAYED_RELEASE_TABLET | Freq: Two times a day (BID) | ORAL | Status: DC
  Administered 2013-10-01 – 2013-10-03 (×5): 325 mg via ORAL
  Filled 2013-10-01 (×8): qty 1

## 2013-10-01 MED ORDER — MIDAZOLAM HCL 2 MG/2ML IJ SOLN
INTRAMUSCULAR | Status: AC
  Filled 2013-10-01: qty 2

## 2013-10-01 MED ORDER — GLYCOPYRROLATE 0.2 MG/ML IJ SOLN
INTRAMUSCULAR | Status: AC
  Filled 2013-10-01: qty 4

## 2013-10-01 MED ORDER — METOCLOPRAMIDE HCL 10 MG PO TABS: 5.0000 mg | ORAL_TABLET | Freq: Three times a day (TID) | ORAL | Status: DC | PRN

## 2013-10-01 MED ORDER — DIPHENHYDRAMINE HCL 12.5 MG/5ML PO ELIX
25.0000 mg | ORAL_SOLUTION | ORAL | Status: DC | PRN
  Administered 2013-10-01: 25 mg via ORAL
  Filled 2013-10-01: qty 10

## 2013-10-01 MED ORDER — OXYCODONE HCL 5 MG PO TABS: 5.0000 mg | ORAL_TABLET | ORAL | Status: DC | PRN

## 2013-10-01 MED ORDER — ONDANSETRON HCL 4 MG PO TABS
4.0000 mg | ORAL_TABLET | Freq: Four times a day (QID) | ORAL | Status: DC | PRN
  Administered 2013-10-03: 4 mg via ORAL
  Filled 2013-10-01: qty 1

## 2013-10-01 MED ORDER — LACTATED RINGERS IV SOLN
INTRAVENOUS | Status: DC | PRN
  Administered 2013-10-01 (×2): via INTRAVENOUS

## 2013-10-01 MED ORDER — MIDAZOLAM HCL 5 MG/5ML IJ SOLN
INTRAMUSCULAR | Status: DC | PRN
  Administered 2013-10-01: 2 mg via INTRAVENOUS

## 2013-10-01 MED ORDER — PROPOFOL 10 MG/ML IV BOLUS
INTRAVENOUS | Status: AC
  Filled 2013-10-01: qty 20

## 2013-10-01 MED ORDER — FENTANYL CITRATE 0.05 MG/ML IJ SOLN
INTRAMUSCULAR | Status: AC
  Filled 2013-10-01: qty 5

## 2013-10-01 MED ORDER — SENNA 8.6 MG PO TABS
1.0000 | ORAL_TABLET | Freq: Two times a day (BID) | ORAL | Status: DC
  Administered 2013-10-01 – 2013-10-03 (×5): 8.6 mg via ORAL
  Filled 2013-10-01 (×7): qty 1

## 2013-10-01 MED ORDER — 0.9 % SODIUM CHLORIDE (POUR BTL) OPTIME
TOPICAL | Status: DC | PRN
  Administered 2013-10-01 (×2): 1000 mL

## 2013-10-01 MED ORDER — ROCURONIUM BROMIDE 100 MG/10ML IV SOLN
INTRAVENOUS | Status: DC | PRN
  Administered 2013-10-01: 50 mg via INTRAVENOUS

## 2013-10-01 MED ORDER — HYDROCODONE-ACETAMINOPHEN 5-325 MG PO TABS
1.0000 | ORAL_TABLET | ORAL | Status: DC | PRN
  Administered 2013-10-02 – 2013-10-03 (×6): 2 via ORAL
  Filled 2013-10-01 (×4): qty 2

## 2013-10-01 MED ORDER — PROMETHAZINE HCL 25 MG PO TABS: 25.0000 mg | ORAL_TABLET | Freq: Four times a day (QID) | ORAL | Status: DC | PRN

## 2013-10-01 MED ORDER — HYDROMORPHONE HCL 1 MG/ML IJ SOLN
1.0000 mg | Freq: Once | INTRAMUSCULAR | Status: AC
  Administered 2013-10-01: 1 mg via INTRAVENOUS

## 2013-10-01 MED ORDER — BUPIVACAINE HCL (PF) 0.25 % IJ SOLN
INTRAMUSCULAR | Status: AC
  Filled 2013-10-01: qty 30

## 2013-10-01 MED ORDER — SORBITOL 70 % SOLN: 30.0000 mL | Freq: Every day | Status: DC | PRN

## 2013-10-01 MED ORDER — PROPOFOL 10 MG/ML IV BOLUS
INTRAVENOUS | Status: DC | PRN
  Administered 2013-10-01: 150 mg via INTRAVENOUS
  Administered 2013-10-01: 50 mg via INTRAVENOUS

## 2013-10-01 SURGICAL SUPPLY — 75 items
BANDAGE ELASTIC 4 VELCRO ST LF (GAUZE/BANDAGES/DRESSINGS) IMPLANT
BANDAGE ELASTIC 6 VELCRO ST LF (GAUZE/BANDAGES/DRESSINGS) IMPLANT
BIT DRILL 2.7 QC CANN 155 (BIT) ×1 IMPLANT
BIT DRILL 2.7 QC CANN 155MM (BIT) ×1
BIT DRILL QC 2.7 6.3IN  SHORT (BIT) ×2
BIT DRILL QC 2.7 6.3IN SHORT (BIT) IMPLANT
BNDG CMPR MED 10X6 ELC LF (GAUZE/BANDAGES/DRESSINGS) ×1
BNDG COHESIVE 4X5 TAN STRL (GAUZE/BANDAGES/DRESSINGS) ×3 IMPLANT
BNDG COHESIVE 6X5 TAN STRL LF (GAUZE/BANDAGES/DRESSINGS) ×3 IMPLANT
BNDG ELASTIC 6X10 VLCR STRL LF (GAUZE/BANDAGES/DRESSINGS) ×2 IMPLANT
COVER SURGICAL LIGHT HANDLE (MISCELLANEOUS) ×5 IMPLANT
CUFF TOURNIQUET SINGLE 34IN LL (TOURNIQUET CUFF) ×2 IMPLANT
CUFF TOURNIQUET SINGLE 44IN (TOURNIQUET CUFF) IMPLANT
DRAPE C-ARM 42X72 X-RAY (DRAPES) ×3 IMPLANT
DRAPE C-ARMOR (DRAPES) ×3 IMPLANT
DRAPE IMP U-DRAPE 54X76 (DRAPES) ×3 IMPLANT
DRAPE INCISE IOBAN 66X45 STRL (DRAPES) ×3 IMPLANT
DRAPE U-SHAPE 47X51 STRL (DRAPES) ×4 IMPLANT
DURAPREP 26ML APPLICATOR (WOUND CARE) ×5 IMPLANT
ELECT CAUTERY BLADE 6.4 (BLADE) ×3 IMPLANT
ELECT REM PT RETURN 9FT ADLT (ELECTROSURGICAL) ×3
ELECTRODE REM PT RTRN 9FT ADLT (ELECTROSURGICAL) ×1 IMPLANT
FACESHIELD WRAPAROUND (MASK) ×3 IMPLANT
FACESHIELD WRAPAROUND OR TEAM (MASK) ×1 IMPLANT
GAUZE SPONGE 4X4 12PLY STRL (GAUZE/BANDAGES/DRESSINGS) ×3 IMPLANT
GAUZE XEROFORM 5X9 LF (GAUZE/BANDAGES/DRESSINGS) ×3 IMPLANT
GLOVE BIOGEL PI IND STRL 7.0 (GLOVE) IMPLANT
GLOVE BIOGEL PI INDICATOR 7.0 (GLOVE) ×2
GLOVE SURG SS PI 7.0 STRL IVOR (GLOVE) ×2 IMPLANT
GLOVE SURG SYN 7.5  E (GLOVE) ×4
GLOVE SURG SYN 7.5 E (GLOVE) ×2 IMPLANT
GLOVE SURG SYN 7.5 PF PI (GLOVE) ×2 IMPLANT
GOWN STRL REIN XL XLG (GOWN DISPOSABLE) ×3 IMPLANT
GOWN STRL REUS W/ TWL LRG LVL3 (GOWN DISPOSABLE) ×2 IMPLANT
GOWN STRL REUS W/TWL LRG LVL3 (GOWN DISPOSABLE) ×3
K WIRES ×4 IMPLANT
KIT BASIN OR (CUSTOM PROCEDURE TRAY) ×3 IMPLANT
KIT ROOM TURNOVER OR (KITS) ×3 IMPLANT
NDL HYPO 25GX1X1/2 BEV (NEEDLE) IMPLANT
NEEDLE HYPO 25GX1X1/2 BEV (NEEDLE) ×3 IMPLANT
NS IRRIG 1000ML POUR BTL (IV SOLUTION) ×3 IMPLANT
PACK ORTHO EXTREMITY (CUSTOM PROCEDURE TRAY) ×3 IMPLANT
PAD ARMBOARD 7.5X6 YLW CONV (MISCELLANEOUS) ×6 IMPLANT
PAD CAST 3X4 CTTN HI CHSV (CAST SUPPLIES) ×2 IMPLANT
PADDING CAST ABS 4INX4YD NS (CAST SUPPLIES) ×2
PADDING CAST ABS 6INX4YD NS (CAST SUPPLIES) ×2
PADDING CAST ABS COTTON 4X4 ST (CAST SUPPLIES) IMPLANT
PADDING CAST ABS COTTON 6X4 NS (CAST SUPPLIES) IMPLANT
PADDING CAST COTTON 3X4 STRL (CAST SUPPLIES)
PADDING CAST COTTON 6X4 STRL (CAST SUPPLIES) ×1 IMPLANT
PLATE FIBULA DISTAL 7 HOLE (Plate) ×2 IMPLANT
SCREW CANNULATED 4.0X35 (Screw) ×2 IMPLANT
SCREW CANNULATED 4.0X36 (Screw) ×2 IMPLANT
SCREW LOCK 3.5X14 (Screw) ×2 IMPLANT
SCREW LOCK 3.5X16 (Screw) ×6 IMPLANT
SCREW NL 3.5X14 (Screw) ×2 IMPLANT
SCREW NLCK 16X3.5XST CORT PRLC (Screw) IMPLANT
SCREW NON LOCK 3.5X12 (Screw) ×4 IMPLANT
SCREW NONLOCK 3.5X10 (Screw) ×4 IMPLANT
SCREW NONLOCK 3.5X16 (Screw) ×3 IMPLANT
SPLINT FIBERGLASS 4X30 (CAST SUPPLIES) ×4 IMPLANT
SPONGE LAP 18X18 X RAY DECT (DISPOSABLE) ×6 IMPLANT
SUCTION FRAZIER TIP 10 FR DISP (SUCTIONS) ×3 IMPLANT
SUT ETHILON 2 0 FS 18 (SUTURE) IMPLANT
SUT ETHILON 3 0 FSL (SUTURE) ×2 IMPLANT
SUT ETHILON 3 0 PS 1 (SUTURE) ×4 IMPLANT
SUT VIC AB 0 CT1 27 (SUTURE) ×3
SUT VIC AB 0 CT1 27XBRD ANBCTR (SUTURE) IMPLANT
SUT VIC AB 2-0 CT1 27 (SUTURE) ×6
SUT VIC AB 2-0 CT1 TAPERPNT 27 (SUTURE) IMPLANT
SYR CONTROL 10ML LL (SYRINGE) ×2 IMPLANT
TOWEL OR 17X24 6PK STRL BLUE (TOWEL DISPOSABLE) ×3 IMPLANT
TOWEL OR 17X26 10 PK STRL BLUE (TOWEL DISPOSABLE) ×4 IMPLANT
TUBE CONNECTING 12'X1/4 (SUCTIONS) ×1
TUBE CONNECTING 12X1/4 (SUCTIONS) ×2 IMPLANT

## 2013-10-01 NOTE — H&P (Signed)
PREOPERATIVE H&P  Chief Complaint: Left bimalleolar ankle fracture  HPI: Angel Mendez is a 48 y.o. female who presents for surgical treatment of Left bimalleolar ankle fracture.  She denies any changes in medical history.  Past Medical History  Diagnosis Date  . Seasonal allergies   . Diabetes mellitus     NIDDM  . Meniscus tear 04/2011    right knee  . Depression     currently on meds  . GERD (gastroesophageal reflux disease)     treats w/OTC meds  . Wears glasses   . Pneumonia    Past Surgical History  Procedure Laterality Date  . No past surgeries    . Knee arthroscopy  04/17/2011    Procedure: ARTHROSCOPY KNEE;  Surgeon: Sherri Rad, MD;  Location: [facility];  Service: Orthopedics;  Laterality: Right;  Removal of Plica and Medial Menical Debridement  . Esophagogastroduodenoscopy  01/09/2012    Procedure: ESOPHAGOGASTRODUODENOSCOPY (EGD);  Surgeon: Barrie Folk, MD;  Location: [facility];  Service: Endoscopy;  Laterality: N/A;  . Ankle surgery     History   Social History  . Marital Status: Single    Spouse Name: N/A    Number of Children: N/A  . Years of Education: N/A   Social History Main Topics  . Smoking status: Current Every Day Smoker -- 0.50 packs/day for 15 years    Types: Cigarettes  . Smokeless tobacco: Never Used     Comment: 5 cig./day  . Alcohol Use: Yes     Comment: " social beer"  . Drug Use: No     Comment: " used crack-cocaine in 2002 "  . Sexual Activity: None   Other Topics Concern  . None   Social History Narrative  . None   Family History  Problem Relation Age of Onset  . Cancer - Other Mother   . Seizures Father   . Cancer - Lung Other    No Known Allergies Prior to Admission medications   Medication Sig Start Date End Date Taking? Authorizing Provider  ARIPiprazole (ABILIFY) 10 MG tablet Take 10 mg by mouth daily.   Yes Historical Provider, MD  Ascorbic Acid (VITAMIN C PO) Take 1 tablet by  mouth daily.   Yes Historical Provider, MD  metFORMIN (GLUCOPHAGE) 500 MG tablet Take 500 mg by mouth daily with breakfast.   Yes Historical Provider, MD  pantoprazole (PROTONIX) 40 MG tablet Take 40 mg by mouth daily.   Yes Historical Provider, MD  HYDROcodone-acetaminophen (NORCO/VICODIN) 5-325 MG per tablet Take 2 tablets by mouth 2 (two) times daily as needed for moderate pain or severe pain. 09/19/13   Tomasita Crumble, MD     Positive ROS: All other systems have been reviewed and were otherwise negative with the exception of those mentioned in the HPI and as above.  Physical Exam: General: Alert, no acute distress Cardiovascular: No pedal edema Respiratory: No cyanosis, no use of accessory musculature GI: abdomen soft Skin: No lesions in the area of chief complaint Neurologic: Sensation intact distally Psychiatric: Patient is competent for consent with normal mood and affect Lymphatic: no lymphedema  MUSCULOSKELETAL:  - swelling improved - skin wrinkles  Assessment: Left bimalleolar ankle fracture  Plan: Plan for Procedure(s): OPEN REDUCTION INTERNAL FIXATION (ORIF) LEFT ANKLE FRACTURE  The risks benefits and alternatives were discussed with the patient including but not limited to the risks of nonoperative treatment, versus surgical intervention including infection, bleeding, nerve injury,  blood clots, cardiopulmonary complications,  morbidity, mortality, among others, and they were willing to proceed.   Cheral Almas, MD   10/01/2013 6:56 AM

## 2013-10-01 NOTE — Transfer of Care (Signed)
Immediate Anesthesia Transfer of Care Note  Patient: Angel Mendez  Procedure(s) Performed: Procedure(s): OPEN REDUCTION INTERNAL FIXATION (ORIF) LEFT ANKLE FRACTURE (Left)  Patient Location: PACU  Anesthesia Type:General  Level of Consciousness: awake  Airway & Oxygen Therapy: Patient Spontanous Breathing and Patient connected to nasal cannula oxygen  Post-op Assessment: Report given to PACU RN and Post -op Vital signs reviewed and stable  Post vital signs: stable  Complications: No apparent anesthesia complications

## 2013-10-01 NOTE — Op Note (Signed)
Date of Surgery: 10/01/2013  INDICATIONS: Ms. Hribar is a 48 y.o.-year-old female who sustained a left ankle fracture; she was indicated for open reduction and internal fixation due to the displaced nature of the articular fracture and came to the operating room today for this procedure. The patient did consent to the procedure after discussion of the risks and benefits.  PREOPERATIVE DIAGNOSIS: left bimalleolar ankle fracture  POSTOPERATIVE DIAGNOSIS: Same.  PROCEDURE: Open treatment of left ankle fracture with internal fixation. Bimalleolar CPT [phone]  SURGEON: N. Glee Arvin, M.D.  ASSIST: none.  ANESTHESIA:  general  IV FLUIDS AND URINE: See anesthesia.  ESTIMATED BLOOD LOSS: minimal mL.  IMPLANTS: Katrinka Blazing and Nephew 7 hole distal fibula plate  COMPLICATIONS: None.  DESCRIPTION OF PROCEDURE: The patient was brought to the operating room and placed supine on the operating table.  The patient had been signed prior to the procedure and this was documented. The patient had the anesthesia placed by the anesthesiologist.  A nonsterile tourniquet was placed on the upper thigh.  The prep verification and incision time-outs were performed to confirm that this was the correct patient, site, side and location. The patient had an SCD on the opposite lower extremity. The patient did receive antibiotics prior to the incision and was re-dosed during the procedure as needed at indicated intervals.  The patient had the lower extremity prepped and draped in the standard surgical fashion.  The extremity was exsanguinated using an esmarch bandage and the tourniquet was inflated to 300 mm Hg.  The bony landmarks were palpated and a longitudinal incision over the lateral aspect of the fibula was used. Blunt dissection was taken down to the fascia. The superficial peroneal nerve was not encountered. The fascia was sharply incised in line with the incision. The fibula and the fracture site were  exposed. Any organized hematoma was cleared from the fracture site. There was a significant amount of comminution within the fracture site. Of note the bone quality was poor. A lag screw was not possible. The decision was made to fix the fracture in a bridge fashion. A 7-hole fibular plate was placed on the lateral aspect of the fibula. I did my best to achieve reduction of the fracture and length of the fibula. This was confirmed on fluoroscopy. The plate was then placed on the lateral aspect of the fibula and bridge fashion. Three non-locking screws were placed proximal to the fracture.  An additional 4 locking screws were placed distal to the fracture due to the poor quality of the bone. Once this was done we then turned our attention to the medial malleolus fracture. A curvilinear longitudinal incision based over the medial malleolus was used. Blunt dissection was taken down to the saphenous vein and nerve. These were retracted and protected during the entirety of the case. The fracture site was then exposed. Any entrapped periosteum was retrieved from the fracture site. Once this was done the fracture was reduced this was confirmed on fluoroscopy. I then placed 2 parallel K wires to maintain provisional reduction. I then placed a 36 mm 4.0 mm cannulated partially threaded screw over the anterior wire to gain compression across the fracture. I then placed a 35 mm 4.0 mm cannulated fully threaded screw over the posterior wire. Final x-rays were then taken. The wires were taken out. An external rotation stress test was used to assess the syndesmosis and demonstrated no widening of the medial clear space. Final x-rays were taken. The wounds were thoroughly irrigated  with normal saline. The wounds were closed in layer fashion using 0 Vicryl for the fascia, 2-0 Vicryl for the subcutaneous layer, 3-0 nylon for the skin. Sterile dressings were placed. The leg was immobilized in a short-leg splint with the ankle in  neutral dorsiflexion. The patient was extubated and transferred to the PACU in stable condition.  POSTOPERATIVE PLAN: Ms. Stotler will remain nonweightbearing on this leg for approximately 6 weeks; Ms. Dewalt will return for suture removal in 2 weeks.  She will be immobilized in a short leg splint.  Ms. Austad will receive DVT prophylaxis based on other medications, activity level, and risk ratio of bleeding to thrombosis.  Mayra Reel, MD [facility] [phone] 9:43 AM

## 2013-10-01 NOTE — Anesthesia Preprocedure Evaluation (Signed)
Anesthesia Evaluation  Patient identified by MRN, date of birth, ID band Patient awake    Reviewed: Allergy & Precautions, H&P , NPO status , Patient's Chart, lab work & pertinent test results  Airway       Dental   Pulmonary Current Smoker,          Cardiovascular     Neuro/Psych Depression    GI/Hepatic GERD-  ,  Endo/Other  diabetes, Type 2  Renal/GU      Musculoskeletal   Abdominal   Peds  Hematology   Anesthesia Other Findings   Reproductive/Obstetrics                           Anesthesia Physical Anesthesia Plan  ASA: II  Anesthesia Plan: General   Post-op Pain Management:    Induction: Intravenous  Airway Management Planned: Oral ETT  Additional Equipment:   Intra-op Plan:   Post-operative Plan: Extubation in OR  Informed Consent: I have reviewed the patients History and Physical, chart, labs and discussed the procedure including the risks, benefits and alternatives for the proposed anesthesia with the patient or authorized representative who has indicated his/her understanding and acceptance.     Plan Discussed with: CRNA, Anesthesiologist and Surgeon  Anesthesia Plan Comments:         Anesthesia Quick Evaluation

## 2013-10-01 NOTE — Anesthesia Postprocedure Evaluation (Signed)
  Anesthesia Post-op Note  Patient: Angel Mendez  Procedure(s) Performed: Procedure(s): OPEN REDUCTION INTERNAL FIXATION (ORIF) LEFT ANKLE FRACTURE (Left)  Patient Location: PACU  Anesthesia Type:General  Level of Consciousness: awake, alert , oriented and patient cooperative  Airway and Oxygen Therapy: Patient Spontanous Breathing  Post-op Pain: moderate  Post-op Assessment: Post-op Vital signs reviewed, Patient's Cardiovascular Status Stable, Respiratory Function Stable, Patent Airway and No signs of Nausea or vomiting  Post-op Vital Signs: stable  Last Vitals:  Filed Vitals:   10/01/13 1130  BP:   Pulse: 69  Temp:   Resp: 14    Complications: No apparent anesthesia complications

## 2013-10-01 NOTE — Discharge Instructions (Signed)
1. Strict non weight bearing to left leg 2. Must take aspirin to prevent blood clots 3. Take pain meds as needed

## 2013-10-01 NOTE — Progress Notes (Signed)
CBG at 2200 is 122.

## 2013-10-01 NOTE — Progress Notes (Signed)
May give Dilaudid 2 additional mg in pacu -per Dr Katrinka Blazing

## 2013-10-02 DIAGNOSIS — S82843A Displaced bimalleolar fracture of unspecified lower leg, initial encounter for closed fracture: Secondary | ICD-10-CM | POA: Diagnosis not present

## 2013-10-02 LAB — GLUCOSE, CAPILLARY
Glucose-Capillary: 138 mg/dL — ABNORMAL HIGH (ref 70–99)
Glucose-Capillary: 167 mg/dL — ABNORMAL HIGH (ref 70–99)
Glucose-Capillary: 121 mg/dL — ABNORMAL HIGH (ref 70–99)
Glucose-Capillary: 138 mg/dL — ABNORMAL HIGH (ref 70–99)

## 2013-10-02 LAB — URINALYSIS, ROUTINE W REFLEX MICROSCOPIC
Bilirubin Urine: NEGATIVE
Glucose, UA: NEGATIVE mg/dL
Hgb urine dipstick: NEGATIVE
Ketones, ur: NEGATIVE mg/dL
Leukocytes, UA: NEGATIVE
Nitrite: NEGATIVE
Protein, ur: NEGATIVE mg/dL
Specific Gravity, Urine: 1.028 (ref 1.005–1.030)
Urobilinogen, UA: 1 mg/dL (ref 0.0–1.0)
pH: 5.5 (ref 5.0–8.0)

## 2013-10-02 MED ORDER — INFLUENZA VAC SPLIT QUAD 0.5 ML IM SUSY
0.5000 mL | PREFILLED_SYRINGE | INTRAMUSCULAR | Status: AC
  Administered 2013-10-03: 0.5 mL via INTRAMUSCULAR
  Filled 2013-10-02: qty 0.5

## 2013-10-02 MED ORDER — PNEUMOCOCCAL VAC POLYVALENT 25 MCG/0.5ML IJ INJ
0.5000 mL | INJECTION | INTRAMUSCULAR | Status: AC
  Administered 2013-10-03: 0.5 mL via INTRAMUSCULAR
  Filled 2013-10-02 (×2): qty 0.5

## 2013-10-02 NOTE — Evaluation (Signed)
Physical Therapy Evaluation Patient Details Name: Angel Mendez MRN: [id number] DOB: April 11, 1965 Today's Date: 10/02/2013   History of Present Illness  Patient fell and sustained left ankle fx.  ORIF 10/01/13  Clinical Impression  Patient did well with bed mobility and transfers.  Very limited in ambulation by fatigue.  Unable to go up/down stairs due to anxiety/fear/unsteadiness.  Feel patient would benefit from further PT to progress ambulation and increase independence.      Follow Up Recommendations Home health PT    Equipment Recommendations  None recommended by PT    Recommendations for Other Services       Precautions / Restrictions Precautions Precautions: Fall Restrictions Weight Bearing Restrictions: Yes LLE Weight Bearing: Non weight bearing      Mobility  Bed Mobility Overal bed mobility: Modified Independent             General bed mobility comments: used railing to get to EOB  Transfers Overall transfer level: Needs assistance Equipment used: Crutches Transfers: Sit to/from Stand Sit to Stand: Supervision         General transfer comment: cueing for hand placement and placement of crutches  Ambulation/Gait Ambulation/Gait assistance: Supervision Ambulation Distance (Feet): 10 Feet (x 2) Assistive device: Crutches Gait Pattern/deviations: Step-to pattern   Gait velocity interpretation: <1.8 ft/sec, indicative of risk for recurrent falls General Gait Details: patient only able to ambulate 10 and then c/o fatigue.  Able to maintain NWB for that distance.  Stairs Stairs: Yes       General stair comments: patient unable to go up stairs - did not feel steady, too scared.  Reports she will sit on steps and bump up, although I explained this is harder than anticipated.    Wheelchair Mobility    Modified Rankin (Stroke Patients Only)       Balance Overall balance assessment: Needs assistance Sitting-balance support: No upper  extremity supported Sitting balance-Leahy Scale: Good     Standing balance support: Bilateral upper extremity supported Standing balance-Leahy Scale: Poor                               Pertinent Vitals/Pain Pain Assessment: 0-10 Pain Score: 2  Pain Descriptors / Indicators: Aching Pain Intervention(s): Monitored during session;Premedicated before session    Home Living Family/patient expects to be discharged to:: Private residence Living Arrangements: Spouse/significant other (out of town today) Available Help at Discharge: Family;Friend(s) Type of Home: Apartment Home Access: Stairs to enter   [employer] of Steps: 3 without rail and then 7 with rail Home Layout: One level Home Equipment: Crutches      Prior Function Level of Independence: Independent (prior to initial injury)               Hand Dominance        Extremity/Trunk Assessment   Upper Extremity Assessment: Overall WFL for tasks assessed           Lower Extremity Assessment: LLE deficits/detail   LLE Deficits / Details: hip/knee wfl  Cervical / Trunk Assessment: Normal  Communication   Communication: No difficulties  Cognition Arousal/Alertness: Awake/alert Behavior During Therapy: WFL for tasks assessed/performed Overall Cognitive Status: Within Functional Limits for tasks assessed                      General Comments      Exercises        Assessment/Plan  PT Assessment Patient needs continued PT services  PT Diagnosis Difficulty walking   PT Problem List Decreased activity tolerance;Decreased balance;Decreased mobility;Decreased knowledge of use of DME;Decreased knowledge of precautions  PT Treatment Interventions DME instruction;Gait training;Stair training;Functional mobility training;Therapeutic activities;Balance training;Patient/family education   PT Goals (Current goals can be found in the Care Plan section) Acute Rehab PT Goals Patient  Stated Goal: go home PT Goal Formulation: With patient Time For Goal Achievement: 10/04/13 Potential to Achieve Goals: Good    Frequency Min 6X/week   Barriers to discharge Inaccessible home environment;Decreased caregiver support      Co-evaluation               End of Session Equipment Utilized During Treatment: Gait belt Activity Tolerance: Patient limited by fatigue Patient left: in chair;with call bell/phone within reach Nurse Communication: Mobility status    Functional Assessment Tool Used: clinical judgement Functional Limitation: Mobility: Walking and moving around Mobility: Walking and Moving Around Current Status (U9811): At least 20 percent but less than 40 percent impaired, limited or restricted Mobility: Walking and Moving Around Goal Status [phone]): At least 1 percent but less than 20 percent impaired, limited or restricted    Time: 1136-1201 PT Time Calculation (min): 25 min   Charges:   PT Evaluation $Initial PT Evaluation Tier I: 1 Procedure PT Treatments $Gait Training: 8-22 mins   PT G Codes:   Functional Assessment Tool Used: clinical judgement Functional Limitation: Mobility: Walking and moving around    Olivia Canter, [place] [phone] 10/02/2013, 12:07 PM

## 2013-10-02 NOTE — Evaluation (Signed)
Occupational Therapy Evaluation Patient Details Name: Angel Mendez MRN: [id number] DOB: 19-Dec-1965 Today's Date: 10/02/2013    History of Present Illness Patient fell and sustained left ankle fx.  ORIF 10/01/13   Clinical Impression   Pt admitted with the above diagnoses and presents with below problem list. Pt will benefit from continued acute OT to address the below listed deficits and maximize independence with basic ADLs prior to d/c home. Prior to injury pt was independent with ADLs. Pt currently at supervision-min guard for ADLs. Pt reports no assistance available today at home as boyfriend is out of town today; he will return tomorrow.      Follow Up Recommendations  Supervision - Intermittent;No OT follow up    Equipment Recommendations  3 in 1 bedside comode    Recommendations for Other Services       Precautions / Restrictions Precautions Precautions: Fall Precaution Comments: reviewed NWB and impact on ADLs Restrictions Weight Bearing Restrictions: Yes LLE Weight Bearing: Non weight bearing      Mobility Bed Mobility              General bed mobility comments: in recliner  Transfers Overall transfer level: Needs assistance Equipment used: Rolling walker (2 wheeled) Transfers: Sit to/from Stand Sit to Stand: Supervision         General transfer comment: Crutches unavailable during session so transfers/in-room ambulation completed with rw. cued to keep rw in front during pivoting, back up until legs touch surface of seat before sitting down    Balance Overall balance assessment: Needs assistance Sitting-balance support: No upper extremity supported;Feet supported Sitting balance-Leahy Scale: Good     Standing balance support: Bilateral upper extremity supported;During functional activity Standing balance-Leahy Scale: Poor Standing balance comment: difficulty with dynamic standing balance as expected with NWB LE status; educated on  compensatory techniques                            ADL Overall ADL's : Needs assistance/impaired Eating/Feeding: Set up;Sitting   Grooming: Set up;Sitting;Standing   Upper Body Bathing: Set up;Sitting   Lower Body Bathing: Min guard;Sit to/from stand   Upper Body Dressing : Set up;Sitting   Lower Body Dressing: Min guard;With adaptive equipment;Sit to/from stand   Toilet Transfer: Min guard;Ambulation;RW (3n1 over toilet)   Toileting- Clothing Manipulation and Hygiene: Min guard;Sit to/from stand   Tub/ Shower Transfer: Min guard;Ambulation;3 in 1;Rolling walker   Functional mobility during ADLs: Min guard;Rolling walker General ADL Comments: Educated on techniques and AE for safe completion of ADLs with NWB LLE. Educated on energy conservation techniques and having someone with her when she showers.      Vision                     Perception     Praxis      Pertinent Vitals/Pain Pain Assessment: 0-10 Pain Score: 6  Pain Location: L ankle Pain Descriptors / Indicators: Aching Pain Intervention(s): Limited activity within patient's tolerance;Monitored during session     Hand Dominance     Extremity/Trunk Assessment Upper Extremity Assessment Upper Extremity Assessment: Overall WFL for tasks assessed   Lower Extremity Assessment Lower Extremity Assessment: Defer to PT evaluation LLE Deficits / Details: hip/knee wfl LLE: Unable to fully assess due to immobilization   Cervical / Trunk Assessment Cervical / Trunk Assessment: Normal   Communication Communication Communication: No difficulties   Cognition Arousal/Alertness: Awake/alert Behavior During Therapy: [facility]  for tasks assessed/performed Overall Cognitive Status: Within Functional Limits for tasks assessed                     General Comments       Exercises       Shoulder Instructions      Home Living Family/patient expects to be discharged to:: Private  residence Living Arrangements: Other (Comment);Spouse/significant other (out of town today, returning tomorrow) Available Help at Discharge: Family;Friend(s);Available PRN/intermittently Type of Home: Apartment Home Access: Stairs to enter Entrance Stairs-Number of Steps: 3 without rail and then 7 with rail   Home Layout: One level     Bathroom Shower/Tub: Tub/shower unit Shower/tub characteristics: Engineer, building services: Standard Bathroom Accessibility: Yes How Accessible: Accessible via walker Home Equipment: Crutches          Prior Functioning/Environment Level of Independence: Independent             OT Diagnosis: Acute pain   OT Problem List: Impaired balance (sitting and/or standing);Decreased knowledge of use of DME or AE;Decreased knowledge of precautions;Pain   OT Treatment/Interventions: Self-care/ADL training;Therapeutic exercise;DME and/or AE instruction;Therapeutic activities;Patient/family education;Balance training    OT Goals(Current goals can be found in the care plan section) Acute Rehab OT Goals Patient Stated Goal: not stated OT Goal Formulation: With patient Time For Goal Achievement: 10/09/13 Potential to Achieve Goals: Good ADL Goals Pt Will Perform Lower Body Bathing: with modified independence;with adaptive equipment;sit to/from stand Pt Will Perform Lower Body Dressing: with modified independence;with adaptive equipment;sit to/from stand Pt Will Transfer to Toilet: with modified independence;ambulating (3n1 over toilet) Pt Will Perform Toileting - Clothing Manipulation and hygiene: with modified independence;sit to/from stand Pt Will Perform Tub/Shower Transfer: with modified independence;ambulating;3 in 1;rolling walker  OT Frequency: Min 2X/week   Barriers to D/C: Decreased caregiver support;Other (comment) (family not available today to assist; SO returns tomorrow)          Co-evaluation              End of Session Equipment  Utilized During Treatment: Gait belt;Rolling walker  Activity Tolerance: Patient tolerated treatment well;Patient limited by fatigue Patient left: in chair;with call bell/phone within reach   Time: 1220-1240 OT Time Calculation (min): 20 min Charges:  OT General Charges $OT Visit: 1 Procedure OT Evaluation $Initial OT Evaluation Tier I: 1 Procedure OT Treatments $Self Care/Home Management : 8-22 mins G-Codes: OT G-codes **NOT FOR INPATIENT CLASS** Functional Assessment Tool Used: clincal judgment Functional Limitation: Self care Self Care Current Status (A2130): At least 1 percent but less than 20 percent impaired, limited or restricted Self Care Goal Status (Q6578): At least 1 percent but less than 20 percent impaired, limited or restricted Self Care Discharge Status [phone]): At least 1 percent but less than 20 percent impaired, limited or restricted  Pilar Grammes 10/02/2013, 12:57 PM

## 2013-10-02 NOTE — Clinical Social Work Note (Signed)
Clinical Social Worker received referral for possible ST-SNF placement.  Chart reviewed.  PT/OT pending, however per MD note, patient to return home today following PT.  Spoke with RN Case Manager who will follow up with patient to discuss home health needs if needed prior to discharge.    CSW signing off - please re consult if social work needs arise.  Macario Golds, [place] [phone]

## 2013-10-02 NOTE — Progress Notes (Signed)
Patient due to void, unable to void spontaneously.  Bladder scan performed, 597 ml observed.  In and out cath performed and obtained at total of 600 ml.  Will continue to monitor and encourage fluids.

## 2013-10-02 NOTE — Progress Notes (Signed)
   Subjective:  Patient reports pain as moderate.  No events.  Objective:   VITALS:   Filed Vitals:   10/01/13 1716 10/01/13 2119 10/02/13 0151 10/02/13 0554  BP: 111/64 92/58 103/61 112/62  Pulse: 72 64 72 80  Temp: 98.3 F (36.8 C) 98.5 F (36.9 C) 98.6 F (37 C) 98.7 F (37.1 C)  TempSrc: Oral Oral Oral   Resp: Weight:      SpO2: 97% 90% 91% 90%    Well fitting splint Toes wwp, wiggling NVI Splint c/d/i   Lab Results  Component Value Date   WBC 10.0 09/30/2013   HGB 13.4 09/30/2013   HCT 38.9 09/30/2013   MCV 92.8 09/30/2013   PLT 306 09/30/2013     Assessment/Plan:  1 Day Post-Op   - Up with PT/OT - DVT ppx - SCDs, ambulation, asa - NWB right lower extremity - Pain control - Discharge planning - anticipate dc home today after PT eval  Cheral Almas 10/02/2013, 7:23 AM [phone]

## 2013-10-03 DIAGNOSIS — S82843A Displaced bimalleolar fracture of unspecified lower leg, initial encounter for closed fracture: Secondary | ICD-10-CM | POA: Diagnosis not present

## 2013-10-03 LAB — GLUCOSE, CAPILLARY
Glucose-Capillary: 140 mg/dL — ABNORMAL HIGH (ref 70–99)
Glucose-Capillary: 117 mg/dL — ABNORMAL HIGH (ref 70–99)

## 2013-10-03 NOTE — Discharge Summary (Signed)
Physician Discharge Summary      Patient ID: Angel Mendez MRN: [id number] DOB/AGE: December 06, 1965 47 y.o.  Admit date: 10/01/2013 Discharge date: 10/03/2013  Admission Diagnoses:  Right bimalleolar ankle fracture  Discharge Diagnoses:  Active Problems:   Bimalleolar ankle fracture   Past Medical History  Diagnosis Date  . Seasonal allergies   . Diabetes mellitus     NIDDM  . Meniscus tear 04/2011    right knee  . Depression     currently on meds  . GERD (gastroesophageal reflux disease)     treats w/OTC meds  . Wears glasses   . Pneumonia     Surgeries: Procedure(s): OPEN REDUCTION INTERNAL FIXATION (ORIF) LEFT ANKLE FRACTURE on 10/01/2013   Consultants (if any):    Discharged Condition: Improved  Hospital Course: Angel Mendez is an 48 y.o. female who was admitted 10/01/2013 with a diagnosis of right ankle fracture and went to the operating room on 10/01/2013 and underwent the above named procedures.    She was given perioperative antibiotics:      Anti-infectives   Start     Dose/Rate Route Frequency Ordered Stop   10/01/13 1200  ceFAZolin (ANCEF) IVPB 2 g/50 mL premix     2 g 100 mL/hr over 30 Minutes Intravenous Every 6 hours 10/01/13 1151 10/02/13 0028   10/01/13 0600  ceFAZolin (ANCEF) IVPB 2 g/50 mL premix     2 g 100 mL/hr over 30 Minutes Intravenous On call to O.R. 09/30/13 1431 10/01/13 0711    .  She was given sequential compression devices, early ambulation, and aspirin for DVT prophylaxis.  She benefited maximally from the hospital stay and there were no complications.    Recent vital signs:  Filed Vitals:   10/03/13 1515  BP: 132/75  Pulse: 85  Temp: 98.2 F (36.8 C)  Resp: 18    Recent laboratory studies:  Lab Results  Component Value Date   HGB 13.4 09/30/2013   HGB 14.6 03/28/2013   HGB 13.5 03/28/2013   Lab Results  Component Value Date   WBC 10.0 09/30/2013   PLT 306 09/30/2013   No results found for  this basename: INR   Lab Results  Component Value Date   NA 134* 09/30/2013   K 3.7 09/30/2013   CL 101 09/30/2013   CO2 19 09/30/2013   BUN 9 09/30/2013   CREATININE 0.63 09/30/2013   GLUCOSE 164* 09/30/2013    Discharge Medications:     Medication List         ARIPiprazole 10 MG tablet  Commonly known as:  ABILIFY  Take 10 mg by mouth daily.     aspirin EC 325 MG tablet  Take 1 tablet (325 mg total) by mouth 2 (two) times daily.     HYDROcodone-acetaminophen 5-325 MG per tablet  Commonly known as:  NORCO/VICODIN  Take 2 tablets by mouth 2 (two) times daily as needed for moderate Mendez or severe Mendez.     metFORMIN 500 MG tablet  Commonly known as:  GLUCOPHAGE  Take 500 mg by mouth daily with breakfast.     oxyCODONE 5 MG immediate release tablet  Commonly known as:  Oxy IR/ROXICODONE  Take 1-3 tablets (5-15 mg total) by mouth every 4 (four) hours as needed.     pantoprazole 40 MG tablet  Commonly known as:  PROTONIX  Take 40 mg by mouth daily.     promethazine 25 MG tablet  Commonly known as:  PHENERGAN  Take 1 tablet (25 mg total) by mouth every 6 (six) hours as needed for nausea.     senna-docusate 8.6-50 MG per tablet  Commonly known as:  SENOKOT S  Take 1 tablet by mouth at bedtime as needed.     VITAMIN C PO  Take 1 tablet by mouth daily.        Diagnostic Studies: Dg Tibia/fibula Left  09/19/2013   CLINICAL DATA:  Ankle fracture. Evaluate remainder of the tibia and fibula.  EXAM: LEFT TIBIA AND FIBULA - 2 VIEW  COMPARISON:  09/19/2013  FINDINGS: Mildly comminuted distal fibular fracture is again noted.  There is no evidence of proximal or mid tibial/ fibular abnormalities.  Minimal tricompartmental degenerative changes in the knee are present.  IMPRESSION: No evidence of proximal or mid tibial/ fibular abnormality.  Distal fibular fracture again noted.   Electronically Signed   By: Laveda Abbe M.D.   On: 09/19/2013 08:00   Dg Ankle 2 Views Left  09/19/2013    CLINICAL DATA:  Postreduction left ankle fracture.  EXAM: LEFT ANKLE - 2 VIEW  COMPARISON:  Films earlier today.  FINDINGS: Overlying cast placement obscures fine detail.  A mildly comminuted distal fibular fracture is again noted, in near-anatomic alignment and position.  A medial malleolar fracture is again noted distracted by approximately 5 mm.  Widening of medial mortise is again noted.  No other changes identified.  IMPRESSION: Distal fibular and medial malleolar fractures with widening of the medial mortise again noted.   Electronically Signed   By: Laveda Abbe M.D.   On: 09/19/2013 09:30   Dg Ankle Complete Left  10/01/2013   CLINICAL DATA:  ORIF OF LEFT ANKLE  EXAM: DG C-ARM 61-120 MIN; LEFT ANKLE COMPLETE - 3+ VIEW  TECHNIQUE: Three views of the left ankle were obtained portably in the operating room Thea C-arm radiography.  CONTRAST:  None  FLUOROSCOPY TIME:  1 min 13 seconds  COMPARISON:  09/19/2013.  FINDINGS: There are 2 orthopedic screws reducing the medial malleolar fracture. A sideplate and screw device reduces the distal fibular fracture. The hardware components and fracture fragments are in anatomic alignment. No complications.  IMPRESSION: 1. Status post ORIF of distal fibular and medial malleolar fractures.   Electronically Signed   By: Signa Kell M.D.   On: 10/01/2013 09:24   Dg Ankle Complete Left  09/19/2013   CLINICAL DATA:  Status post fall; left ankle injury, with medial ankle Mendez.  EXAM: LEFT ANKLE COMPLETE - 3+ VIEW  COMPARISON:  Left ankle radiographs performed 05/07/2006  FINDINGS: There is a comminuted fracture involving the distal fibular diaphysis, with mild lateral displacement. A mildly displaced medial malleolar fracture is also seen. The posterior malleolus appears intact. There is mild medial widening of the ankle mortise.  The interosseous space appears grossly intact. The subtalar joint is grossly unremarkable in appearance.  Diffuse soft tissue swelling is noted  about the ankle, more prominent medially.  IMPRESSION: 1. Comminuted fracture involving the distal fibular diaphysis, with mild lateral displacement. Mildly displaced medial malleolar fracture also seen. 2. Mild medial widening of the ankle mortise.   Electronically Signed   By: Roanna Raider M.D.   On: 09/19/2013 06:22   Dg C-arm 1-60 Min  10/01/2013   CLINICAL DATA:  ORIF OF LEFT ANKLE  EXAM: DG C-ARM 61-120 MIN; LEFT ANKLE COMPLETE - 3+ VIEW  TECHNIQUE: Three views of the left ankle were obtained portably in the operating room Thea C-arm radiography.  CONTRAST:  None  FLUOROSCOPY TIME:  1 min 13 seconds  COMPARISON:  09/19/2013.  FINDINGS: There are 2 orthopedic screws reducing the medial malleolar fracture. A sideplate and screw device reduces the distal fibular fracture. The hardware components and fracture fragments are in anatomic alignment. No complications.  IMPRESSION: 1. Status post ORIF of distal fibular and medial malleolar fractures.   Electronically Signed   By: Signa Kell M.D.   On: 10/01/2013 09:24    Disposition: 01-Home or Self Care  Discharge Instructions   Call MD / Call 911    Complete by:  As directed   If you experience chest Mendez or shortness of breath, CALL 911 and be transported to the hospital emergency room.  If you develope a fever above 101.5 F, pus (white drainage) or increased drainage or redness at the wound, or calf Mendez, call your surgeon's office.     Call MD / Call 911    Complete by:  As directed   If you experience chest Mendez or shortness of breath, CALL 911 and be transported to the hospital emergency room.  If you develope a fever above 101.5 F, pus (white drainage) or increased drainage or redness at the wound, or calf Mendez, call your surgeon's office.     Constipation Prevention    Complete by:  As directed   Drink plenty of fluids.  Prune juice may be helpful.  You may use a stool softener, such as Colace (over the counter) 100 mg twice a day.  Use  MiraLax (over the counter) for constipation as needed.     Constipation Prevention    Complete by:  As directed   Drink plenty of fluids.  Prune juice may be helpful.  You may use a stool softener, such as Colace (over the counter) 100 mg twice a day.  Use MiraLax (over the counter) for constipation as needed.     Diet - low sodium heart healthy    Complete by:  As directed      Diet - low sodium heart healthy    Complete by:  As directed      Diet general    Complete by:  As directed      Diet general    Complete by:  As directed      Driving restrictions    Complete by:  As directed   No driving while taking narcotic Mendez meds.     Driving restrictions    Complete by:  As directed   No driving while taking narcotic Mendez meds.     Increase activity slowly as tolerated    Complete by:  As directed      Increase activity slowly as tolerated    Complete by:  As directed      Non weight bearing    Complete by:  As directed            Follow-up Information   Follow up with Cheral Almas, MD In 2 weeks. (For suture removal, For wound re-check)    Specialty:  Orthopedic Surgery   Contact information:   [address] [place] [address] [phone]       Follow up with [facility]. (3 n 1 (commode, rolling walker))    Contact information:   [address] [place] [address] [phone]       Follow up with [facility]. (home health physical therapy )  Contact information:   [address] [place] [address] [phone]        Signed: Cheral Almas 10/03/2013, 4:46 PM

## 2013-10-03 NOTE — Progress Notes (Signed)
CARE MANAGEMENT NOTE 10/03/2013  Patient:  Angel Mendez, Angel Mendez   Account Number:  [id number]  Date Initiated:  10/03/2013  Documentation initiated by:  [facility]  Subjective/Objective Assessment:   surgical treatment of Left bimalleolar ankle fracture     Action/Plan:   d/c planning   Anticipated DC Date:  10/03/2013   Anticipated DC Plan:  HOME W HOME HEALTH SERVICES      DC Planning Services  CM consult      [facility] Choice  HOME HEALTH  DURABLE MEDICAL EQUIPMENT   Choice offered to / List presented to:  C-1 Patient   DME arranged  WALKER - ROLLING  3-N-1      DME agency  [facility]     HH arranged  HH-2 PT      [facility] agency  [facility]   Status of service:  Completed, signed off Medicare Important Message given?   (If response is "NO", the following Medicare IM given date fields will be blank) Date Medicare IM given:   Medicare IM given by:   Date Additional Medicare IM given:   Additional Medicare IM given by:    Discharge Disposition:  HOME W HOME HEALTH SERVICES  Per UR Regulation:    If discussed at Long Length of Stay Meetings, dates discussed:    Comments:  10/03/13 12:21pm CM spoke with pt. regarding d/c planning needs, offered choice and verified pt's contact information.   Pt. choose [facility] for DME & HH. HH referral called to [facility].   DME referral called to [facility] @ [facility].  No further CM needs communicated at this time.   Shakaria Raphael H. Excell Seltzer, RN, BSN, CCM  [phone]).

## 2013-10-03 NOTE — Progress Notes (Signed)
   Subjective:  Patient reports pain as mild.  Unable to void yesterday, foley inserted  Objective:   VITALS:   Filed Vitals:   10/02/13 2026 10/02/13 2100 10/03/13 0240 10/03/13 0519  BP: 116/69   118/68  Pulse: 74   80  Temp: 99.2 F (37.3 C)   98.4 F (36.9 C)  TempSrc: Oral   Oral  Resp: Height:   (1.702 m)    Weight:  102.513 kg (226 lb)    SpO2: 93%  95% 93%    Well fitting splint Toes wwp, wiggling NVI Splint c/d/i   Lab Results  Component Value Date   WBC 10.0 09/30/2013   HGB 13.4 09/30/2013   HCT 38.9 09/30/2013   MCV 92.8 09/30/2013   PLT 306 09/30/2013     Assessment/Plan:  2 Days Post-Op   - cleared PT - dc home if able to void - voiding trial this am  Cheral Almas 10/03/2013, 7:20 AM [phone]

## 2013-10-03 NOTE — Progress Notes (Signed)
Prescriptions were NOT in paper chart.  Patient was advised to go to MD office on Monday 10/03/2013 for paper prescriptions.

## 2013-10-03 NOTE — Progress Notes (Signed)
Physical Therapy Treatment Patient Details Name: Angel Mendez MRN: [id number] DOB: [birth year] Today's Date: 10/03/2013    History of Present Illness Patient fell and sustained left ankle fx.  ORIF 10/01/13    PT Comments    Pt agreeable to participate in PT session.  Practiced stairs today with pt expressing fear of hopping up/down steps with crutches & reports she has been using scooting on bottom technique for a while.  Pt able to demonstrate & this clinician feels comfortable with way pt performs although does require min cueing to maintain NWBing but pt assures me that she is not bearing any weight through that LE.      Follow Up Recommendations  Home health PT     Equipment Recommendations  None recommended by PT    Recommendations for Other Services       Precautions / Restrictions Precautions Precautions: Fall Precaution Comments: reviewed NWB and impact on ADLs Restrictions LLE Weight Bearing: Non weight bearing    Mobility  Bed Mobility Overal bed mobility: Modified Independent             General bed mobility comments: supine>sit  Transfers Overall transfer level: Needs assistance   Transfers: Sit to/from Stand Sit to Stand: Min assist;Supervision         General transfer comment: Min assist to stand from steps after bumping down on bottom. but supervision when standing from bed or chair.    Ambulation/Gait Ambulation/Gait assistance: Supervision Ambulation Distance (Feet): 40 Feet Assistive device: Crutches Gait Pattern/deviations: Step-to pattern     General Gait Details: cues to ensure balance before taking next hop   Stairs Stairs: Yes Stairs assistance: Min guard Stair Management: No rails;One rail Right;With crutches (bumping down/up on bottom) Number of Stairs: 10 (7 + 3) General stair comments: Pt able to use crutches & hop up 3 steps to simulate getting inside house but then uses scooting on bottom technique for 7  steps.  Pt reports she has been doing it this way for several months & she is afraid to use crutches & hop up/down steps.    Wheelchair Mobility    Modified Rankin (Stroke Patients Only)       Balance                                    Cognition Arousal/Alertness: Awake/alert Behavior During Therapy: WFL for tasks assessed/performed Overall Cognitive Status: Within Functional Limits for tasks assessed                      Exercises      General Comments        Pertinent Vitals/Pain Pain Assessment: 0-10 Pain Score: 3  Pain Location: Lt ankle Pain Descriptors / Indicators: Discomfort Pain Intervention(s): Monitored during session;Repositioned;Premedicated before session    Home Living                      Prior Function            PT Goals (current goals can now be found in the care plan section) Acute Rehab PT Goals PT Goal Formulation: With patient Time For Goal Achievement: 10/04/13 Potential to Achieve Goals: Good Progress towards PT goals: Progressing toward goals    Frequency  Min 6X/week    PT Plan Current plan remains appropriate    Co-evaluation  End of Session Equipment Utilized During Treatment: Gait belt Activity Tolerance: Patient tolerated treatment well Patient left: in chair;with call bell/phone within reach     Time: 1307-1330 PT Time Calculation (min): 23 min  Charges:  $Gait Training: 8-22 mins $Therapeutic Activity: 8-22 mins                    G Codes:      Lara Mulch 10/03/2013, 1:38 PM  Verdell Face, [place] [phone] 10/03/2013

## 2013-10-07 ENCOUNTER — Encounter (HOSPITAL_COMMUNITY): Payer: Self-pay | Admitting: Orthopaedic Surgery

## 2014-06-24 ENCOUNTER — Emergency Department (HOSPITAL_COMMUNITY)
Admission: EM | Admit: 2014-06-24 | Discharge: 2014-06-25 | Disposition: A | Discharge status: Home / Self Care | Payer: Medicaid Other | Attending: Emergency Medicine | Admitting: Emergency Medicine

## 2014-06-24 ENCOUNTER — Encounter (HOSPITAL_COMMUNITY): Payer: Self-pay | Admitting: Family Medicine

## 2014-06-24 ENCOUNTER — Emergency Department (HOSPITAL_COMMUNITY): Payer: Medicaid Other

## 2014-06-24 DIAGNOSIS — K5791 Diverticulosis of intestine, part unspecified, without perforation or abscess with bleeding: Secondary | ICD-10-CM

## 2014-06-24 DIAGNOSIS — Z8701 Personal history of pneumonia (recurrent): Secondary | ICD-10-CM | POA: Insufficient documentation

## 2014-06-24 DIAGNOSIS — K8020 Calculus of gallbladder without cholecystitis without obstruction: Secondary | ICD-10-CM | POA: Diagnosis not present

## 2014-06-24 DIAGNOSIS — E119 Type 2 diabetes mellitus without complications: Secondary | ICD-10-CM | POA: Insufficient documentation

## 2014-06-24 DIAGNOSIS — Z8659 Personal history of other mental and behavioral disorders: Secondary | ICD-10-CM | POA: Diagnosis not present

## 2014-06-24 DIAGNOSIS — Z87828 Personal history of other (healed) physical injury and trauma: Secondary | ICD-10-CM | POA: Insufficient documentation

## 2014-06-24 DIAGNOSIS — K219 Gastro-esophageal reflux disease without esophagitis: Secondary | ICD-10-CM | POA: Diagnosis not present

## 2014-06-24 DIAGNOSIS — K5790 Diverticulosis of intestine, part unspecified, without perforation or abscess without bleeding: Secondary | ICD-10-CM | POA: Insufficient documentation

## 2014-06-24 DIAGNOSIS — Z79899 Other long term (current) drug therapy: Secondary | ICD-10-CM | POA: Insufficient documentation

## 2014-06-24 DIAGNOSIS — Z720 Tobacco use: Secondary | ICD-10-CM | POA: Diagnosis not present

## 2014-06-24 DIAGNOSIS — K625 Hemorrhage of anus and rectum: Secondary | ICD-10-CM | POA: Diagnosis present

## 2014-06-24 DIAGNOSIS — K922 Gastrointestinal hemorrhage, unspecified: Secondary | ICD-10-CM

## 2014-06-24 LAB — CBC
HCT: 43 % (ref 36.0–46.0)
Hemoglobin: 14.4 g/dL (ref 12.0–15.0)
MCH: 31.3 pg (ref 26.0–34.0)
MCHC: 33.5 g/dL (ref 30.0–36.0)
MCV: 93.5 fL (ref 78.0–100.0)
Platelets: 188 10*3/uL (ref 150–400)
RBC: 4.6 MIL/uL (ref 3.87–5.11)
RDW: 14 % (ref 11.5–15.5)
WBC: 9 10*3/uL (ref 4.0–10.5)

## 2014-06-24 LAB — COMPREHENSIVE METABOLIC PANEL
ALT: 43 U/L (ref 14–54)
AST: 47 U/L — ABNORMAL HIGH (ref 15–41)
Albumin: 3.2 g/dL — ABNORMAL LOW (ref 3.5–5.0)
Alkaline Phosphatase: 88 U/L (ref 38–126)
Anion gap: 6 (ref 5–15)
BUN: 8 mg/dL (ref 6–20)
CO2: 24 mmol/L (ref 22–32)
Calcium: 9 mg/dL (ref 8.9–10.3)
Chloride: 106 mmol/L (ref 101–111)
Creatinine, Ser: 0.98 mg/dL (ref 0.44–1.00)
GFR calc Af Amer: >60 mL/min (ref >60)
GFR calc non Af Amer: >60 mL/min (ref >60)
Glucose, Bld: 172 mg/dL — ABNORMAL HIGH (ref 65–99)
Potassium: 3.8 mmol/L (ref 3.5–5.1)
Sodium: 136 mmol/L (ref 135–145)
Total Bilirubin: 0.7 mg/dL (ref 0.3–1.2)
Total Protein: 7.5 g/dL (ref 6.5–8.1)

## 2014-06-24 LAB — POC OCCULT BLOOD, ED: Fecal Occult Bld: POSITIVE — AB

## 2014-06-24 MED ORDER — MORPHINE SULFATE 4 MG/ML IJ SOLN
4.0000 mg | Freq: Once | INTRAMUSCULAR | Status: AC
  Administered 2014-06-24: 4 mg via INTRAVENOUS
  Filled 2014-06-24: qty 1

## 2014-06-24 MED ORDER — IOHEXOL 300 MG/ML  SOLN
100.0000 mL | Freq: Once | INTRAMUSCULAR | Status: AC | PRN
Start: 2014-06-24 — End: 2014-06-24
  Administered 2014-06-24: 100 mL via INTRAVENOUS

## 2014-06-24 NOTE — ED Provider Notes (Signed)
All is no exhibits intact suture media CSN: [id number]     Arrival date & time 06/24/14  1753 History   First MD Initiated Contact with Patient 06/24/14 2008     Chief Complaint  Patient presents with  . Rectal Bleeding     (Consider location/radiation/quality/duration/timing/severity/associated sxs/prior Treatment) HPI Comments: 49 year old female with history of substance abuse, diabetes, depression presents with worsening rectal bleeding and left lower quadrant abdominal discomfort/cramping since Monday. Every time she stools medium color blood. No history of similar, no family history of colon cancer, no history of diverticular daily low cyst/ulcer/hemorrhoids/polyps known. Patient denies, history. No blood thinners. Nothing has improved, occurs every time she stools.  Patient is a 49 y.o. female presenting with hematochezia. The history is provided by the patient.  Rectal Bleeding Associated symptoms: abdominal pain   Associated symptoms: no fever, no light-headedness and no vomiting     Past Medical History  Diagnosis Date  . Seasonal allergies   . Diabetes mellitus     NIDDM  . Meniscus tear 04/2011    right knee  . Depression     currently on meds  . GERD (gastroesophageal reflux disease)     treats w/OTC meds  . Wears glasses   . Pneumonia    Past Surgical History  Procedure Laterality Date  . No past surgeries    . Knee arthroscopy  04/17/2011    Procedure: ARTHROSCOPY KNEE;  Surgeon: Sherri Rad, MD;  Location: [facility];  Service: Orthopedics;  Laterality: Right;  Removal of Plica and Medial Menical Debridement  . Esophagogastroduodenoscopy  01/09/2012    Procedure: ESOPHAGOGASTRODUODENOSCOPY (EGD);  Surgeon: Barrie Folk, MD;  Location: [facility];  Service: Endoscopy;  Laterality: N/A;  . Ankle surgery    . Orif ankle fracture Left 10/01/2013    Procedure: OPEN REDUCTION INTERNAL FIXATION (ORIF) LEFT ANKLE FRACTURE;  Surgeon: Cheral Almas,  MD;  Location: [facility];  Service: Orthopedics;  Laterality: Left;   Family History  Problem Relation Age of Onset  . Cancer - Other Mother   . Seizures Father   . Cancer - Lung Other    History  Substance Use Topics  . Smoking status: Current Every Day Smoker -- 0.50 packs/day for 15 years    Types: Cigarettes  . Smokeless tobacco: Never Used     Comment: 5 cig./day  . Alcohol Use: Yes     Comment: " social beer"   OB History    No data available     Review of Systems  Constitutional: Negative for fever and chills.  HENT: Negative for congestion.   Eyes: Negative for visual disturbance.  Respiratory: Negative for shortness of breath.   Cardiovascular: Negative for chest pain.  Gastrointestinal: Positive for abdominal pain, blood in stool and hematochezia. Negative for vomiting.  Genitourinary: Negative for dysuria and flank pain.  Musculoskeletal: Negative for back pain, neck pain and neck stiffness.  Skin: Negative for rash.  Neurological: Negative for light-headedness and headaches.      Allergies  Review of patient's allergies indicates no known allergies.  Home Medications   Prior to Admission medications   Medication Sig Start Date End Date Taking? Authorizing Provider  Ascorbic Acid (VITAMIN C PO) Take 1 tablet by mouth daily.   Yes Historical Provider, MD  metFORMIN (GLUCOPHAGE) 500 MG tablet Take 500 mg by mouth daily with breakfast.   Yes Historical Provider, MD  pantoprazole (PROTONIX) 40 MG tablet Take 40 mg by mouth  daily.   Yes Historical Provider, MD  aspirin EC 325 MG tablet Take 1 tablet (325 mg total) by mouth 2 (two) times daily. Patient not taking: Reported on 06/24/2014 10/01/13   Tarry Kos, MD  HYDROcodone-acetaminophen (NORCO/VICODIN) 5-325 MG per tablet Take 2 tablets by mouth 2 (two) times daily as needed for moderate pain or severe pain. Patient not taking: Reported on 06/24/2014 09/19/13   Tomasita Crumble, MD  oxyCODONE (OXY IR/ROXICODONE) 5 MG  immediate release tablet Take 1-3 tablets (5-15 mg total) by mouth every 4 (four) hours as needed. Patient not taking: Reported on 06/24/2014 10/01/13   Tarry Kos, MD  promethazine (PHENERGAN) 25 MG tablet Take 1 tablet (25 mg total) by mouth every 6 (six) hours as needed for nausea. Patient not taking: Reported on 06/24/2014 10/01/13   Tarry Kos, MD  senna-docusate (SENOKOT S) 8.6-50 MG per tablet Take 1 tablet by mouth at bedtime as needed. Patient not taking: Reported on 06/24/2014 10/01/13   Tarry Kos, MD   BP 110/66 mmHg  Pulse 75  Temp(Src) 98.3 F (36.8 C) (Oral)  Resp 20  SpO2 93%  LMP 06/04/2014 Physical Exam  Constitutional: She is oriented to person, place, and time. She appears well-developed and well-nourished.  HENT:  Head: Normocephalic and atraumatic.  Eyes: Conjunctivae are normal. Right eye exhibits no discharge. Left eye exhibits no discharge.  Neck: Normal range of motion. Neck supple. No tracheal deviation present.  Cardiovascular: Normal rate and regular rhythm.   Pulmonary/Chest: Effort normal and breath sounds normal.  Abdominal: Soft. She exhibits no distension. There is tenderness (mild left lower quadrant). There is no guarding.  Genitourinary:  Gross blood rectal exam no active bleeding, no external hemorrhoid good rectal tone  Musculoskeletal: She exhibits no edema.  Neurological: She is alert and oriented to person, place, and time.  Skin: Skin is warm. No rash noted.  Psychiatric: She has a normal mood and affect.  Nursing note and vitals reviewed.   ED Course  Procedures (including critical care time) Labs Review Labs Reviewed  COMPREHENSIVE METABOLIC PANEL - Abnormal; Notable for the following:    Glucose, Bld 172 (*)    Albumin 3.2 (*)    AST 47 (*)    All other components within normal limits  POC OCCULT BLOOD, ED - Abnormal; Notable for the following:    Fecal Occult Bld POSITIVE (*)    All other components within normal limits  CBC   POC OCCULT BLOOD, ED  I-STAT BETA HCG BLOOD, ED ([facility], [facility], [facility] ONLY)    Imaging Review Ct Abdomen Pelvis W Contrast  06/24/2014   CLINICAL DATA:  49 year old female with intermittent lower abdominal pain for 5 days. Rectal bleeding. Initial encounter.  EXAM: CT ABDOMEN AND PELVIS WITH CONTRAST  TECHNIQUE: Multidetector CT imaging of the abdomen and pelvis was performed using the standard protocol following bolus administration of intravenous contrast.  CONTRAST:  OMNIPAQUE IOHEXOL 300 MG/ML  SOLN  COMPARISON:  CT Abdomen and Pelvis 01/22/2005  FINDINGS: Partially visible calcified left hilar lymph node in the chest compatible with prior granulomatous process. Mild scarring and/or atelectasis in the lower lobes. No pericardial or pleural effusion.  No acute osseous abnormality identified.  No pelvic free fluid. Uterus and adnexa within normal limits. Unremarkable bladder. Negative rectum and sigmoid colon aside from a mild volume of retained stool. No inguinal or pelvic lymphadenopathy.  Redundant proximal sigmoid and left colon, otherwise unremarkable. Redundant splenic flexure. Retained stool  throughout most of the colon. There is mild to moderate diverticulosis of the transverse colon and hepatic flexure with no active inflammation identified. Negative right colon and appendix. Negative terminal ileum. No dilated small bowel. Decompressed stomach and duodenum appear within normal limits.  Decreased density throughout the liver compatible with steatosis. Cholelithiasis is evident. No pericholecystic inflammation. Spleen, pancreas, and adrenal glands are within normal limits. The portal venous system appears patent. Major arterial structures are patent. Mild aortoiliac calcified atherosclerosis noted. No abdominal free fluid or free air. Renal enhancement and contrast excretion within normal limits. No abdominal lymphadenopathy.  IMPRESSION: 1. No acute or inflammatory process in the abdomen or pelvis. 2.  Cholelithiasis without CT evidence of acute cholecystitis. 3. Right colon diverticulosis, no active inflammation identified. Normal appendix. 4. Fatty liver disease.   Electronically Signed   By: Odessa Fleming M.D.   On: 06/24/2014 23:31     EKG Interpretation None      MDM   Final diagnoses:  Acute lower GI bleeding  Diverticulosis of intestine with bleeding, unspecified intestinal tract location  Gallstones   Patient with worsening rectal bleeding, concern for diverticular bleed with mild left lower quadrant abdominal pain. Hemoglobin okay, vitals normal. Plan for CT abdomen for further delineation and likely outpatient follow-up with gastroenterology.  No bleeding up [facility] the ER, vitals normal. CT scan results showed nothing acute. Patient stable for outpatient follow-up discussed or neurology. Results and differential diagnosis were discussed with the patient/parent/guardian. Close follow up outpatient was discussed, comfortable with the plan.   Medications  morphine 4 MG/ML injection 4 mg (4 mg Intravenous Given 06/24/14 2156)  iohexol (OMNIPAQUE) 300 MG/ML solution 100 mL (100 mLs Intravenous Contrast Given 06/24/14 2303)    Filed Vitals:   06/24/14 2200 06/24/14 2215 06/24/14 2230 06/24/14 2245  BP: 126/79 118/73 117/71 110/66  Pulse: 70 74 75 75  Temp:      TempSrc:      Resp:      SpO2: 95% 93% 93% 93%    Final diagnoses:  Acute lower GI bleeding  Diverticulosis of intestine with bleeding, unspecified intestinal tract location  Gallstones        Blane Ohara, MD 06/25/14 0004

## 2014-06-24 NOTE — ED Notes (Signed)
Pt sts rectal bleeding since Monday. sts bright red. sts some abd cramping. Denies rectal pain.

## 2014-06-25 LAB — I-STAT BETA HCG BLOOD, ED (MC, WL, AP ONLY): I-stat hCG, quantitative: <5 m[IU]/mL (ref <5)

## 2014-06-25 NOTE — Discharge Instructions (Signed)
Soft diet, return to the ER if bleeding worsens or he develop persistent lightheadedness or pass out. Follow-up closely with gastroenterology for likely colonoscopy.  If you were given medicines take as directed.  If you are on coumadin or contraceptives realize their levels and effectiveness is altered by many different medicines.  If you have any reaction (rash, tongues swelling, other) to the medicines stop taking and see a physician.    If your blood pressure was elevated in the ER make sure you follow up for management with a primary doctor or return for chest pain, shortness of breath or stroke symptoms.  Please follow up as directed and return to the ER or see a physician for new or worsening symptoms.  Thank you. Filed Vitals:   06/24/14 2200 06/24/14 2215 06/24/14 2230 06/24/14 2245  BP: 126/79 118/73 117/71 110/66  Pulse: 70 74 75 75  Temp:      TempSrc:      Resp:      SpO2: 95% 93% 93% 93%    Bloody Stools Bloody stools often mean that there is a problem in the digestive tract. Your caregiver may use the term "melena" to describe black, tarry, and bad smelling stools or "hematochezia" to describe red or maroon-colored stools. Blood seen in the stool can be caused by bleeding anywhere along the intestinal tract.  A black stool usually means that blood is coming from the upper part of the gastrointestinal tract (esophagus, stomach, or small bowel). Passing maroon-colored stools or bright red blood usually means that blood is coming from lower down in the large bowel or the rectum. However, sometimes massive bleeding in the stomach or small intestine can cause bright red bloody stools.  Consuming black licorice, lead, iron pills, medicines containing bismuth subsalicylate, or blueberries can also cause black stools. Your caregiver can test black stools to see if blood is present. It is important that the cause of the bleeding be found. Treatment can then be started, and the problem can  be corrected. Rectal bleeding may not be serious, but you should not assume everything is okay until you know the cause.It is very important to follow up with your caregiver or a specialist in gastrointestinal problems. CAUSES  Blood in the stools can come from various underlying causes.Often, the cause is not found during your first visit. Testing is often needed to discover the cause of bleeding in the gastrointestinal tract. Causes range from simple to serious or even life-threatening.Possible causes include:  Hemorrhoids.These are veins that are full of blood (engorged) in the rectum. They cause pain, inflammation, and may bleed.  Anal fissures.These are areas of painful tearing which may bleed. They are often caused by passing hard stool.  Diverticulosis.These are pouches that form on the colon over time, with age, and may bleed significantly.  Diverticulitis.This is inflammation in areas with diverticulosis. It can cause pain, fever, and bloody stools, although bleeding is rare.  Proctitis and colitis. These are inflamed areas of the rectum or colon. They may cause pain, fever, and bloody stools.  Polyps and cancer. Colon cancer is a leading cause of preventable cancer death.It often starts out as precancerous polyps that can be removed during a colonoscopy, preventing progression into cancer. Sometimes, polyps and cancer may cause rectal bleeding.  Gastritis and ulcers.Bleeding from the upper gastrointestinal tract (near the stomach) may travel through the intestines and produce black, sometimes tarry, often bad smelling stools. In certain cases, if the bleeding is fast enough, the stools  may not be black, but red and the condition may be life-threatening. SYMPTOMS  You may have stools that are bright red and bloody, that are normal color with blood on them, or that are dark black and tarry. In some cases, you may only have blood in the toilet bowl. Any of these cases need medical  care. You may also have:  Pain at the anus or anywhere in the rectum.  Lightheadedness or feeling faint.  Extreme weakness.  Nausea or vomiting.  Fever. DIAGNOSIS Your caregiver may use the following methods to find the cause of your bleeding:  Taking a medical history. Age is important. Older people tend to develop polyps and cancer more often. If there is anal pain and a hard, large stool associated with bleeding, a tear of the anus may be the cause. If blood drips into the toilet after a bowel movement, bleeding hemorrhoids may be the problem. The color and frequency of the bleeding are additional considerations. In most cases, the medical history provides clues, but seldom the final answer.  A visual and finger (digital) exam. Your caregiver will inspect the anal area, looking for tears and hemorrhoids. A finger exam can provide information when there is tenderness or a growth inside. In men, the prostate is also examined.  Endoscopy. Several types of small, long scopes (endoscopes) are used to view the colon.  In the office, your caregiver may use a rigid, or more commonly, a flexible viewing sigmoidoscope. This exam is called flexible sigmoidoscopy. It is performed in 5 to 10 minutes.  A more thorough exam is accomplished with a colonoscope. It allows your caregiver to view the entire 5 to 6 foot long colon. Medicine to help you relax (sedative) is usually given for this exam. Frequently, a bleeding lesion may be present beyond the reach of the sigmoidoscope. So, a colonoscopy may be the best exam to start with. Both exams are usually done on an outpatient basis. This means the patient does not stay overnight in the hospital or surgery center.  An upper endoscopy may be needed to examine your stomach. Sedation is used and a flexible endoscope is put in your mouth, down to your stomach.  A barium enema X-ray. This is an X-ray exam. It uses liquid barium inserted by enema into the  rectum. This test alone may not identify an actual bleeding point. X-rays highlight abnormal shadows, such as those made by lumps (tumors), diverticuli, or colitis. TREATMENT  Treatment depends on the cause of your bleeding.   For bleeding from the stomach or colon, the caregiver doing your endoscopy or colonoscopy may be able to stop the bleeding as part of the procedure.  Inflammation or infection of the colon can be treated with medicines.  Many rectal problems can be treated with creams, suppositories, or warm baths.  Surgery is sometimes needed.  Blood transfusions are sometimes needed if you have lost a lot of blood.  For any bleeding problem, let your caregiver know if you take aspirin or other blood thinners regularly. HOME CARE INSTRUCTIONS   Take any medicines exactly as prescribed.  Keep your stools soft by eating a diet high in fiber. Prunes (1 to 3 a day) work well for many people.  Drink enough water and fluids to keep your urine clear or pale yellow.  Take sitz baths if advised. A sitz bath is when you sit in a bathtub with warm water for 10 to 15 minutes to soak, soothe, and cleanse the  rectal area.  If enemas or suppositories are advised, be sure you know how to use them. Tell your caregiver if you have problems with this.  Monitor your bowel movements to look for signs of improvement or worsening. SEEK MEDICAL CARE IF:   You do not improve in the time expected.  Your condition worsens after initial improvement.  You develop any new symptoms. SEEK IMMEDIATE MEDICAL CARE IF:   You develop severe or prolonged rectal bleeding.  You vomit blood.  You feel weak or faint.  You have a fever. MAKE SURE YOU:  Understand these instructions.  Will watch your condition.  Will get help right away if you are not doing well or get worse. Document Released: 12/14/2001 Document Revised: 03/18/2011 Document Reviewed: 05/11/2010 [facility] Patient Information 2015  [place], [place]. This information is not intended to replace advice given to you by your health care provider. Make sure you discuss any questions you have with your health care provider.

## 2014-09-08 IMAGING — CR DG ABDOMEN ACUTE W/ 1V CHEST
4 series · 4 of 4 positions shown · non-contrast
Comparison: Prior chest x-ray and rib series 07/01/2006

CLINICAL DATA: Vomiting, hematemesis

ACUTE ABDOMEN SERIES (ABDOMEN 2 VIEW & CHEST 1 VIEW)

[w abdomen decub (1 of 2)]
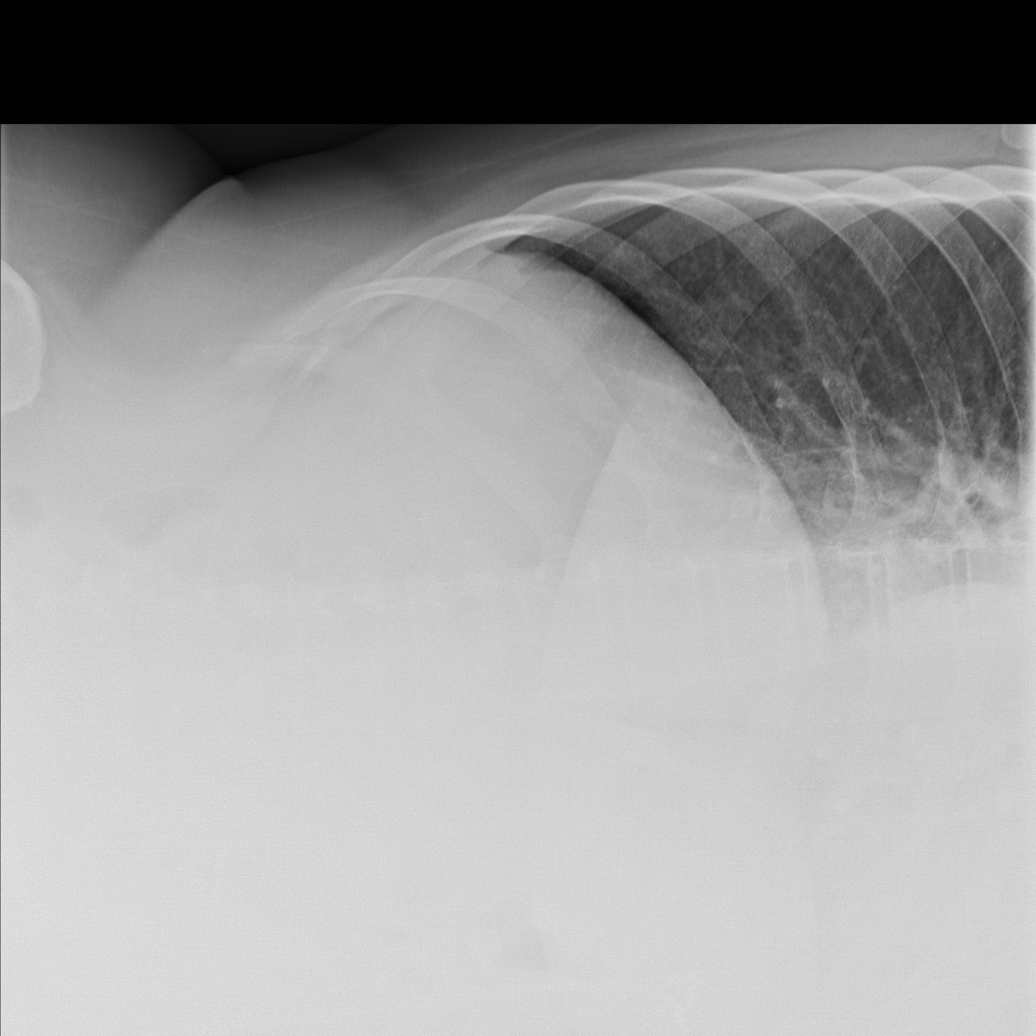

[w abdomen decub (2 of 2)]
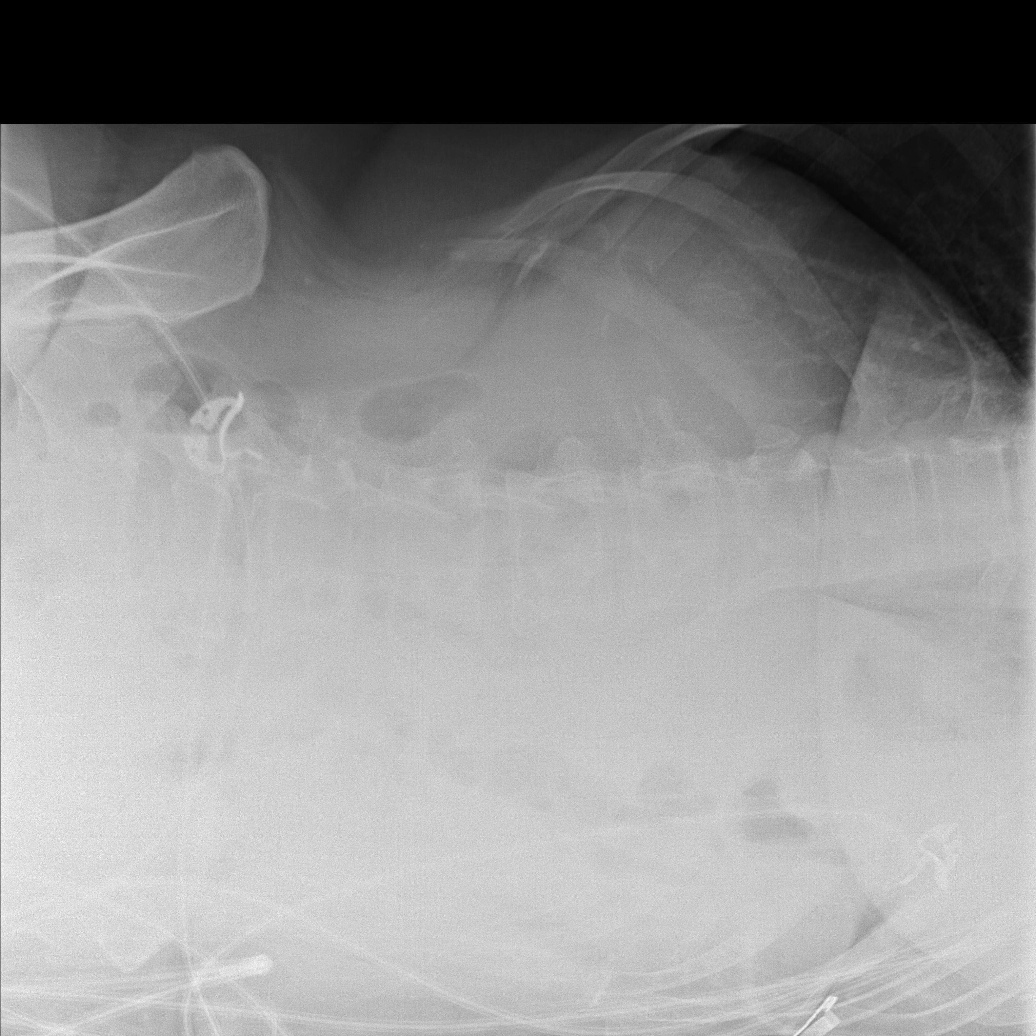

[t abdomen supine]
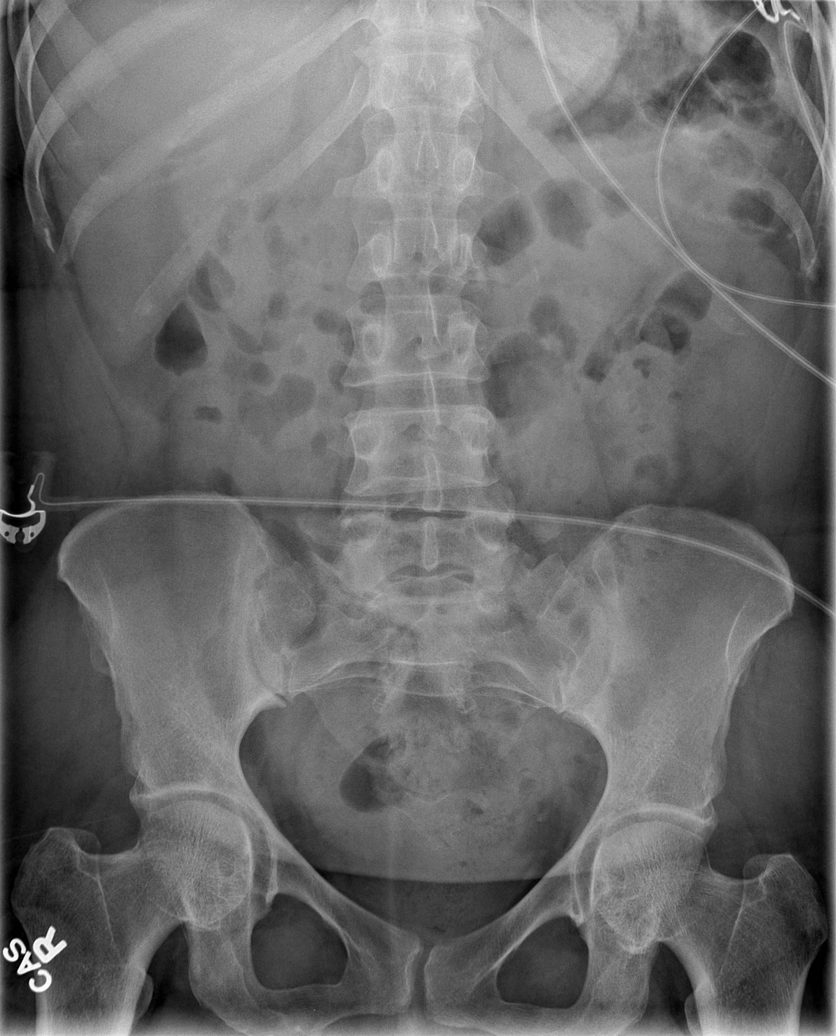

[x chest ap]
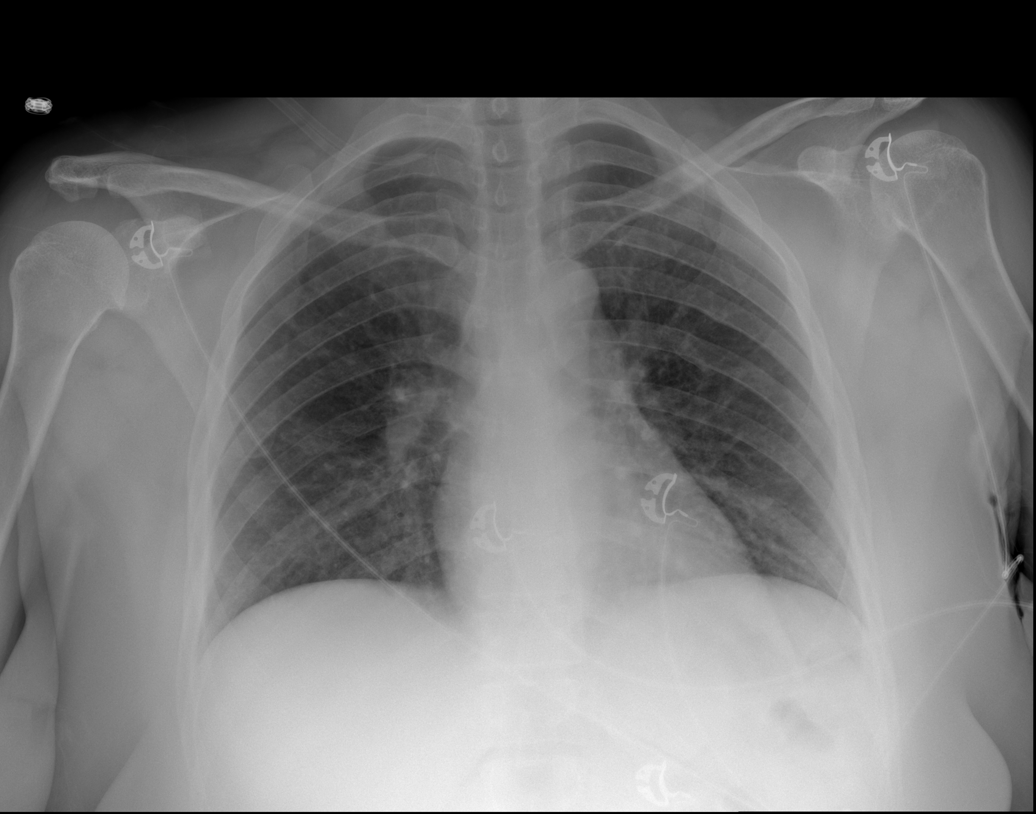

[4 of 4 positions shown; findings below may reference images not displayed]

FINDINGS: The lungs are clear.  No focal airspace consolidation,
pulmonary edema or pleural effusion.  Cardiac and mediastinal
contours within normal limits.  No suspicious pulmonary nodular
opacities.  No acute osseous abnormality.

No evidence of pneumothorax or free air on the decubitus abdominal
views.  Unremarkable bowel gas pattern on the supine view.  Gas
noted throughout the colon to the level of the rectum.  No evidence
of obstruction.  No acute osseous abnormality.
IMPRESSION: 1.  No acute cardiopulmonary disease
2.  No acute abdominal abnormality by conventional radiography]

## 2014-11-21 ENCOUNTER — Emergency Department (HOSPITAL_COMMUNITY)
Admission: EM | Admit: 2014-11-21 | Discharge: 2014-11-21 | Disposition: A | Discharge status: Home / Self Care | Payer: Medicaid Other | Attending: Emergency Medicine | Admitting: Emergency Medicine

## 2014-11-21 ENCOUNTER — Encounter (HOSPITAL_COMMUNITY): Payer: Self-pay | Admitting: *Deleted

## 2014-11-21 DIAGNOSIS — Z8701 Personal history of pneumonia (recurrent): Secondary | ICD-10-CM | POA: Insufficient documentation

## 2014-11-21 DIAGNOSIS — E1165 Type 2 diabetes mellitus with hyperglycemia: Secondary | ICD-10-CM | POA: Insufficient documentation

## 2014-11-21 DIAGNOSIS — F17200 Nicotine dependence, unspecified, uncomplicated: Secondary | ICD-10-CM | POA: Insufficient documentation

## 2014-11-21 DIAGNOSIS — Z79899 Other long term (current) drug therapy: Secondary | ICD-10-CM | POA: Diagnosis not present

## 2014-11-21 DIAGNOSIS — R739 Hyperglycemia, unspecified: Secondary | ICD-10-CM

## 2014-11-21 DIAGNOSIS — Z87828 Personal history of other (healed) physical injury and trauma: Secondary | ICD-10-CM | POA: Insufficient documentation

## 2014-11-21 DIAGNOSIS — D72829 Elevated white blood cell count, unspecified: Secondary | ICD-10-CM | POA: Diagnosis not present

## 2014-11-21 DIAGNOSIS — Z8659 Personal history of other mental and behavioral disorders: Secondary | ICD-10-CM | POA: Insufficient documentation

## 2014-11-21 DIAGNOSIS — R42 Dizziness and giddiness: Secondary | ICD-10-CM | POA: Diagnosis present

## 2014-11-21 DIAGNOSIS — K219 Gastro-esophageal reflux disease without esophagitis: Secondary | ICD-10-CM | POA: Diagnosis not present

## 2014-11-21 LAB — CBC
HCT: 43.2 % (ref 36.0–46.0)
Hemoglobin: 14.7 g/dL (ref 12.0–15.0)
MCH: 31.6 pg (ref 26.0–34.0)
MCHC: 34 g/dL (ref 30.0–36.0)
MCV: 92.9 fL (ref 78.0–100.0)
Platelets: 186 10*3/uL (ref 150–400)
RBC: 4.65 MIL/uL (ref 3.87–5.11)
RDW: 13.3 % (ref 11.5–15.5)
WBC: 10.7 10*3/uL — ABNORMAL HIGH (ref 4.0–10.5)

## 2014-11-21 LAB — URINE MICROSCOPIC-ADD ON

## 2014-11-21 LAB — BASIC METABOLIC PANEL
Anion gap: 9 (ref 5–15)
BUN: 12 mg/dL (ref 6–20)
CO2: 21 mmol/L — ABNORMAL LOW (ref 22–32)
Calcium: 9.2 mg/dL (ref 8.9–10.3)
Chloride: 102 mmol/L (ref 101–111)
Creatinine, Ser: 0.88 mg/dL (ref 0.44–1.00)
GFR calc Af Amer: >60 mL/min (ref >60)
GFR calc non Af Amer: >60 mL/min (ref >60)
Glucose, Bld: 331 mg/dL — ABNORMAL HIGH (ref 65–99)
Potassium: 4.3 mmol/L (ref 3.5–5.1)
Sodium: 132 mmol/L — ABNORMAL LOW (ref 135–145)

## 2014-11-21 LAB — URINALYSIS, ROUTINE W REFLEX MICROSCOPIC
Bilirubin Urine: NEGATIVE
Glucose, UA: >1000 mg/dL — AB
Hgb urine dipstick: NEGATIVE
Ketones, ur: NEGATIVE mg/dL
Nitrite: NEGATIVE
Protein, ur: NEGATIVE mg/dL
Specific Gravity, Urine: 1.035 — ABNORMAL HIGH (ref 1.005–1.030)
Urobilinogen, UA: 1 mg/dL (ref 0.0–1.0)
pH: 5 (ref 5.0–8.0)

## 2014-11-21 LAB — CBG MONITORING, ED: Glucose-Capillary: 348 mg/dL — ABNORMAL HIGH (ref 65–99)

## 2014-11-21 MED ORDER — METFORMIN HCL 500 MG PO TABS: 500.0000 mg | ORAL_TABLET | Freq: Every day | ORAL | Status: DC

## 2014-11-21 MED ORDER — METFORMIN HCL 500 MG PO TABS
500.0000 mg | ORAL_TABLET | Freq: Once | ORAL | Status: AC
  Administered 2014-11-21: 500 mg via ORAL
  Filled 2014-11-21: qty 1

## 2014-11-21 NOTE — ED Provider Notes (Signed)
CSN: [id number]     Arrival date & time 11/21/14  1453 History   First MD Initiated Contact with Patient 11/21/14 1838     Chief Complaint  Patient presents with  . Hyperglycemia    HPI  Angel Mendez is a 49 y.o. female with a PMH of DM, depression, GERD who presents to the ED with hyperglycemia x 3 days. She reports associated blurred vision, headache, and dizziness, which she states is characteristic of the symptoms she typically experiences with hyperglycemia. She denies exacerbating or alleviating factors. She states she has not taken her metformin for 5 days due to running out. She denies fever, chills, chest pain, shortness of breath, abdominal pain, nausea, vomiting, diarrhea, constipation, numbness, weakness, paresthesia.   Past Medical History  Diagnosis Date  . Seasonal allergies   . Diabetes mellitus     NIDDM  . Meniscus tear 04/2011    right knee  . Depression     currently on meds  . GERD (gastroesophageal reflux disease)     treats w/OTC meds  . Wears glasses   . Pneumonia    Past Surgical History  Procedure Laterality Date  . No past surgeries    . Knee arthroscopy  04/17/2011    Procedure: ARTHROSCOPY KNEE;  Surgeon: Sherri RadPaul A Bednarz, MD;  Location: [facility];  Service: Orthopedics;  Laterality: Right;  Removal of Plica and Medial Menical Debridement  . Esophagogastroduodenoscopy  01/09/2012    Procedure: ESOPHAGOGASTRODUODENOSCOPY (EGD);  Surgeon: Barrie FolkJohn C Hayes, MD;  [facility];  Service: Endoscopy;  Laterality: N/A;  . Ankle surgery    . Orif ankle fracture Left 10/01/2013    Procedure: OPEN REDUCTION INTERNAL FIXATION (ORIF) LEFT ANKLE FRACTURE;  Surgeon: Cheral AlmasNaiping Michael Xu, MD;  [facility]: [facility];  Service: Orthopedics;  Laterality: Left;   Family History  Problem Relation Age of Onset  . Cancer - Other Mother   . Seizures Father   . Cancer - Lung Other    Social History  Substance Use Topics  . Smoking status:  Current Every Day Smoker -- 0.50 packs/day for 15 years    Types: Cigarettes  . Smokeless tobacco: Never Used     Comment: 5 cig./day  . Alcohol Use: Yes     Comment: " social beer"   OB History    No data available      Review of Systems  Constitutional: Negative for fever and chills.  Eyes: Positive for visual disturbance.  Respiratory: Negative for shortness of breath.   Cardiovascular: Negative for chest pain.  Gastrointestinal: Negative for nausea, vomiting, abdominal pain, diarrhea and constipation.  Genitourinary: Negative for dysuria, urgency and frequency.  Neurological: Positive for dizziness and headaches. Negative for syncope, weakness, light-headedness and numbness.  All other systems reviewed and are negative.     Allergies  Review of patient's allergies indicates no known allergies.  Home Medications   Prior to Admission medications   Medication Sig Start Date End Date Taking? Authorizing Provider  Ascorbic Acid (VITAMIN C PO) Take 1 tablet by mouth daily.    Historical Provider, MD  aspirin EC 325 MG tablet Take 1 tablet (325 mg total) by mouth 2 (two) times daily. Patient not taking: Reported on 06/24/2014 10/01/13   Tarry KosNaiping M Xu, MD  HYDROcodone-acetaminophen (NORCO/VICODIN) 5-325 MG per tablet Take 2 tablets by mouth 2 (two) times daily as needed for moderate pain or severe pain. Patient not taking: Reported on 06/24/2014 09/19/13   Tomasita CrumbleAdeleke Oni,  MD  metFORMIN (GLUCOPHAGE) 500 MG tablet Take 1 tablet (500 mg total) by mouth daily with breakfast. 11/21/14   Mady Gemma, PA-C  oxyCODONE (OXY IR/ROXICODONE) 5 MG immediate release tablet Take 1-3 tablets (5-15 mg total) by mouth every 4 (four) hours as needed. Patient not taking: Reported on 06/24/2014 10/01/13   Tarry Kos, MD  pantoprazole (PROTONIX) 40 MG tablet Take 40 mg by mouth daily.    Historical Provider, MD  promethazine (PHENERGAN) 25 MG tablet Take 1 tablet (25 mg total) by mouth every 6 (six)  hours as needed for nausea. Patient not taking: Reported on 06/24/2014 10/01/13   Tarry Kos, MD  senna-docusate (SENOKOT S) 8.6-50 MG per tablet Take 1 tablet by mouth at bedtime as needed. Patient not taking: Reported on 06/24/2014 10/01/13   Tarry Kos, MD    BP 123/85 mmHg  Pulse 83  Temp(Src) 98.2 F (36.8 C) (Oral)  Resp 18  Ht  (1.702 m)  Wt 238 lb 7 oz (108.155 kg)  BMI 37.34 kg/m2  SpO2 96%  LMP 11/04/2014 Physical Exam  Constitutional: She is oriented to person, place, and time. She appears well-developed and well-nourished. No distress.  HENT:  Head: Normocephalic and atraumatic.  Right Ear: External ear normal.  Left Ear: External ear normal.  Nose: Nose normal.  Mouth/Throat: Uvula is midline, oropharynx is clear and moist and mucous membranes are normal.  Eyes: Conjunctivae, EOM and lids are normal. Pupils are equal, round, and reactive to light. Right eye exhibits no discharge. Left eye exhibits no discharge. No scleral icterus.  Neck: Normal range of motion. Neck supple.  Cardiovascular: Normal rate, regular rhythm, normal heart sounds, intact distal pulses and normal pulses.   Pulmonary/Chest: Effort normal and breath sounds normal. No respiratory distress. She has no wheezes. She has no rales.  Abdominal: Soft. Normal appearance and bowel sounds are normal. She exhibits no distension and no mass. There is no tenderness. There is no rigidity, no rebound and no guarding.  Musculoskeletal: Normal range of motion. She exhibits no edema or tenderness.  Neurological: She is alert and oriented to person, place, and time. She has normal strength. No cranial nerve deficit or sensory deficit.  Skin: Skin is warm, dry and intact. No rash noted. She is not diaphoretic. No erythema. No pallor.  Psychiatric: She has a normal mood and affect. Her speech is normal and behavior is normal.  Nursing note and vitals reviewed.   ED Course  Procedures (including critical care  time)  Labs Review Labs Reviewed  BASIC METABOLIC PANEL - Abnormal; Notable for the following:    Sodium 132 (*)    CO2 21 (*)    Glucose, Bld 331 (*)    All other components within normal limits  CBC - Abnormal; Notable for the following:    WBC 10.7 (*)    All other components within normal limits  URINALYSIS, ROUTINE W REFLEX MICROSCOPIC (NOT AT [facility]) - Abnormal; Notable for the following:    APPearance HAZY (*)    Specific Gravity, Urine 1.035 (*)    Glucose, UA >1000 (*)    Leukocytes, UA SMALL (*)    All other components within normal limits  URINE MICROSCOPIC-ADD ON - Abnormal; Notable for the following:    Squamous Epithelial / LPF FEW (*)    Bacteria, UA FEW (*)    Casts HYALINE CASTS (*)    Crystals CA OXALATE CRYSTALS (*)    All other components  within normal limits  CBG MONITORING, ED - Abnormal; Notable for the following:    Glucose-Capillary 348 (*)    All other components within normal limits  URINE CULTURE    Imaging Review No results found.   I have personally reviewed and evaluated these lab results as part of my medical decision-making.   EKG Interpretation None      MDM   Final diagnoses:  Hyperglycemia    49 year old female presents with hyperglycemia for the past 3 days, and states she has experienced associated blurred vision, headaches, and dizziness. States she has not taken her metformin for 5 days due to running out. Denies fever, chills, chest pain, shortness of breath, abdominal pain, nausea, vomiting, diarrhea, constipation, numbness, weakness, paresthesia. Currently, patient reports headache is resolved.  Patient is afebrile. Vital signs stable. Heart regular rate and rhythm. Lungs clear to auscultation bilaterally. Abdomen soft, nontender, nondistended. Normal neuro exam with no focal deficit. Strength and sensation intact. No lower extremity edema.   CBC remarkable for mild leukocytosis of 10.7, no anemia. BMP remarkable for glucose  elevated at 331, anion gap within normal limits. No evidence of DKA. UA with glucose >1000, no ketones, small leukocytes. Microscopic with 11-20 WBC, few bacteria, few squamous epithelial cells. Patient denies urinary symptoms, do not feel treatment for UTI is indicated at this time. Will culture urine.  Patient given home dose of metformin in the ED.  Patient is well appearing. Feel she is stable for discharge at this time. Metformin prescription refilled. Patient to follow up with PCP for further evaluation and management. Return precautions discussed. Patient verbalizes her understanding and is in agreement with plan.  BP 123/85 mmHg  Pulse 83  Temp(Src) 98.2 F (36.8 C) (Oral)  Resp 18  Ht  (1.702 m)  Wt 238 lb 7 oz (108.155 kg)  BMI 37.34 kg/m2  SpO2 96%  LMP 11/04/2014      Mady Gemma, PA-C 11/21/14 1919  Gerhard Munch, MD 11/22/14 [phone]

## 2014-11-21 NOTE — ED Notes (Addendum)
Pt reports hyperglycemia x 3 days. Today cbg 398. Reports blurred vision x 2 days, denies n/v. No acute distress noted at this time.

## 2014-11-21 NOTE — Discharge Instructions (Signed)
1. Medications: usual home medications 2. Treatment: rest, drink plenty of fluids 3. Follow Up: please followup with your primary doctor this week for discussion of your diagnoses and further evaluation after today's visit; if you do not have a primary care doctor use the resource guide provided to find one; please return to the ER for severe headache, chest pain, shortness of breath, persistent vomiting, new or worsening symptoms   Hyperglycemia High blood sugar (hyperglycemia) means that the level of sugar in your blood is higher than it should be. Signs of high blood sugar include:  Feeling thirsty.  Frequent peeing (urinating).  Feeling tired or sleepy.  Dry mouth.  Vision changes.  Feeling weak.  Feeling hungry but losing weight.  Numbness and tingling in your hands or feet.  Headache. When you ignore these signs, your blood sugar may keep going up. These problems may get worse, and other problems may begin. HOME CARE  Check your blood sugars as told by your doctor. Write down the numbers with the date and time.  Take the right amount of insulin or diabetes pills at the right time. Write down the dose with date and time.  Refill your insulin or diabetes pills before running out.  Watch what you eat. Follow your meal plan.  Drink liquids without sugar, such as water. Check with your doctor if you have kidney or heart disease.  Follow your doctor's orders for exercise. Exercise at the same time of day.  Keep your doctor's appointments. GET HELP RIGHT AWAY IF:   You have trouble thinking or are confused.  You have fast breathing with fruity smelling breath.  You pass out (faint).  You have 2 to 3 days of high blood sugars and you do not know why.  You have chest pain.  You are feeling sick to your stomach (nauseous) or throwing up (vomiting).  You have sudden vision changes. MAKE SURE YOU:   Understand these instructions.  Will watch your  condition.  Will get help right away if you are not doing well or get worse.   This information is not intended to replace advice given to you by your health care provider. Make sure you discuss any questions you have with your health care provider.   Document Released: 10/21/2008 Document Revised: 01/14/2014 Document Reviewed: 08/30/2014 Elsevier Interactive Patient Education 2016 [employer]   Emergency Department Resource Guide 1) Find a Doctor and Pay Out of Pocket Although you won't have to find out who is covered by your insurance plan, it is a good idea to ask around and get recommendations. You will then need to call the office and see if the doctor you have chosen will accept you as a new patient and what types of options they offer for patients who are self-pay. Some doctors offer discounts or will set up payment plans for their patients who do not have insurance, but you will need to ask so you aren't surprised when you get to your appointment.  2) Contact Your Local Health Department Not all health departments have doctors that can see patients for sick visits, but many do, so it is worth a call to see if yours does. If you don't know where your local health department is, you can check in your phone book. The CDC also has a tool to help you locate your state's health department, and many state websites also have listings of all of their local health departments.  3) Find a Walk-in Clinic If  your illness is not likely to be very severe or complicated, you may want to try a walk in clinic. These are popping up all over the country in pharmacies, drugstores, and shopping centers. They're usually staffed by nurse practitioners or physician assistants that have been trained to treat common illnesses and complaints. They're usually fairly quick and inexpensive. However, if you have serious medical issues or chronic medical problems, these are probably not your best option.  No Primary  Care Doctor: - Call Health Connect at  [phone] - they can help you locate a primary care doctor that  accepts your insurance, provides certain services, etc. - Physician Referral Service- [phone]  Chronic Pain Problems: Organization         Address  Phone   Notes  [facility]  [phone] Patients need to be referred by their primary care doctor.   Medication Assistance: Organization         Address  Phone   Notes  [facility] Med[facility] [address] [place] [place], [place] [address] [phone] --Must be a resident of [facility] -- Must have NO insurance coverage whatsoever (no Medicaid/ Medicare, etc.) -- The pt. MUST have a primary care doctor that directs their care regularly and follows them in the community   MedAssist  [phone]   [employer]  [phone]    Agencies that provide inexpensive medical care: Organization         Address  Phone   Notes  [facility]  [phone]   [facility]    [phone]   [facility] [address] [place], [place] [address] [phone]   [facility] [address] [place] [place] [phone]   Planned Parenthood    [phone]   [facility]    [phone]   [facility]  [address], [place] Phone:  [phone], Fax:  [phone] Hours of Operation:  9 am - 6 pm, M-F.  Also accepts Medicaid/Medicare and self-pay.  [facility]  [address], [place] Phone: [phone], Fax: [phone]. Hours of Operation:  8:30 am - 5:30 pm, M-F.  Also accepts Medicaid and self-pay.  [facility] [place] [address] [place] Phone: [phone]   [facility] [address] Natasha Bence [place], [place] [phone], Ext. [phone] Mondays & Thursdays: 7-9 AM.  First 15 patients are seen on a first  come, first serve basis.    Medicaid-accepting [facility] Providers:  Organization         Address  Phone   Notes  [facility] [address], [place] [phone] Also accepts self-pay patients.  [facility] [address] Laurell Josephs [place], [place]  [phone]   [facility] [address], [place] [phone]   [facility] [address] [place] [phone]   Renaye Rakers [address], [place]   [phone] Only accepts [place] Access [place] patients after they have their name applied to their card.   Self-Pay (no insurance) in [facility]:  Organization         Address  Phone   Notes  Sickle Cell Patients, [facility] [address] [place], [place] [phone]  [facility] [place] [phone]   [facility] [place]  [address], [place] [phone]   [facility]/Dr. Osei-Bonsu  [address] [place] or [address], [place] [phone] Phone number for both [place] and [place] locations is the same.  Urgent Medical and [facility] [address], [place] [phone]   [facility] [address], [place] or [address] [phone]   [facility] [address] [place] [phone], phone; [phone], fax Sees patients 1st and 3rd Saturday of every month.  Must not qualify for public or private insurance (i.e. Medicaid, Medicare, [place] Health Choice, Veterans' Benefits)  Household income should be no more than 200% of the poverty level The clinic cannot treat you if you are pregnant or think you are pregnant  Sexually transmitted diseases are not treated at the clinic.    Dental Care: Organization          Address  Phone  Notes  [facility] Department of [facility] [place] [phone] Accepts children up to age 62 who are enrolled in [place] or [place]; pregnant women with a Medicaid card; and children who have applied for Medicaid or [place] Health Choice, but were declined, whose parents can pay a reduced fee at time of service.  [facility] Department of [facility]  [address], [place] [phone] Accepts children up to age 48 who are enrolled in [place] or [place]; pregnant women with a Medicaid card; and children who have applied for Medicaid or [place] Health Choice, but were declined, whose parents can pay a reduced fee at time of service.  [place] Adult [facility] Access PROGRAM  [place] [phone] Patients are seen by appointment only. Walk-ins are not accepted. [place] will see patients 75 years of age and older. Monday - Tuesday (8am-5pm) Most Wednesdays (8:30-5pm) $30 per visit, cash only  [facility] Adult [facility] Access PRO[facility]  [address], [facility] [phone] Patients are seen by appointment only. Walk-ins are not accepted. [place] will see patients 80 years of age and older. One Wednesday Evening (Monthly: Volunteer Based).  $30 per visit, cash only  [place]  [phone] for adults; Children under age 62, call Graduate Pediatric Dentistry at [phone]. Children aged 58-14, please call [phone] to request a pediatric application.  Dental services are provided in all areas of dental care including fillings, crowns and bridges, complete and partial dentures, implants, gum treatment, root canals, and extractions. Preventive care is also provided. Treatment is provided to both adults and children. Patients are selected via a lottery and there is often a waiting list.   [facility] [address] [place]  [phone] [email or web address]   [facility] [address] [place], [place] [phone], Ext. [phone] Second and Fourth Thursday of each month, opens at 6:30 AM; Clinic ends at 9 AM.  Patients are seen on a first-come first-served basis, and a limited number are seen during each clinic.   [facility]  [address] Hillard Danker [place], [place] [phone]   Eligibility Requirements You must have lived in [place], [place], or [place] counties for at least the  last three months.   You cannot be eligible for state or federal sponsored [employer], including [employer], [place], or [employer].   You generally cannot be eligible for healthcare insurance through your employer.    How to apply: Eligibility screenings are held every Tuesday and Wednesday afternoon from 1:00 pm until 4:00 pm. You do not need an appointment for the interview!  [facility] [address] [place], [place]   [place]  [place] Department  [place]  [phone]    Behavioral Health Resources in the Community: Intensive Outpatient Programs Organization         Address  Phone  Notes  [place]. [address], [place], [place] [phone]   [facility] Outpatient [address] [place]lor[place], [place]   ADS: [facility] [address] [place], [place]   [place] [address],  [place], [place] or [phone]   Substance Abuse Resources Organization         Address  Phone  Notes  Alcohol and Drug Services  [phone]   [place]  [phone]   [facility] [place]   Chinita Pester  [phone]   Residential & Outpatient Substance Abuse Program  [phone]   Psychological  Services Organization         Address  Phone  Notes  [facility] [place]  [place]  [phone]   [place] [address], [place] or [phone]    Mobile Crisis Teams Organization         Address  Phone  Notes  [facility], Mobile Crisis Care Unit  [phone]   [facility]ive Psychotherapeutic Services  [address] [place], [place]   [place] Levels [address], [place] [phone]    Self-Help/Support Groups Organization         Address  Phone             Notes  [place]. of [place] - variety of support groups  [place] Call for more information  Narcotics Anonymous (NA), [facility] [address], [employer] [place]  2 meetings at this location   Special educational needs teacher         Address  Phone  Notes  [facility]AP Residential Treatment [place],    [place]  [phone]   [facility]  [address] [place] [phone], [place], [place]   [facility] [place], [place] [phone] Admissions: 8am-3pm M-F  Incentives Substance [facility] [place] [address]    [place], [place] [phone]   [facility] [address] [place], [place], [place]   [facility] [address]  [place], [place]   [facility] Programs - Intensive Outpatient [address] Kristeen [address], [place], [place]   [facility] ([place].,  [place], [place] [phone] or [phone]   [facility] Treatment Services (RTS) [address] [place], [place] Accepts Medicaid  [facility] [employer] [address],  [place] [phone] Substance Abuse/Addiction Treatment   [facility] Organization         Address  Phone  Notes  [facility]  [phone]  Domenic Schwab, PhD [address] Arlis Porta [place], [place]   [phone] or [phone]   [facility] [place], [place] [phone]   [address], [place], [place] [phone] Insurance/Medicaid/sponsorship through [facility] and Families [address] [place]                                    [place], [place] [phone] [place] [address], [place] [phone]    Dr. Adele Schilder  [phone]   [facility] Dept. 1) [address] [place] 2) [place] 3)  [place] [address], [place] [phone]  [phone]   [place] [phone] or [phone] (After Hours)

## 2014-11-21 NOTE — ED Notes (Signed)
Patient is alert and orientedx4.  Patient was explained discharge instructions and they understood them with no questions.   

## 2014-11-22 LAB — URINE CULTURE: Special Requests: NORMAL

## 2015-02-01 ENCOUNTER — Emergency Department (HOSPITAL_COMMUNITY): Payer: Medicaid Other

## 2015-02-01 ENCOUNTER — Emergency Department (HOSPITAL_COMMUNITY)
Admission: EM | Admit: 2015-02-01 | Discharge: 2015-02-01 | Disposition: A | Discharge status: Home / Self Care | Payer: Medicaid Other | Attending: Emergency Medicine | Admitting: Emergency Medicine

## 2015-02-01 ENCOUNTER — Encounter (HOSPITAL_COMMUNITY): Payer: Self-pay | Admitting: Emergency Medicine

## 2015-02-01 DIAGNOSIS — R05 Cough: Secondary | ICD-10-CM | POA: Diagnosis present

## 2015-02-01 DIAGNOSIS — R509 Fever, unspecified: Secondary | ICD-10-CM | POA: Diagnosis not present

## 2015-02-01 DIAGNOSIS — K219 Gastro-esophageal reflux disease without esophagitis: Secondary | ICD-10-CM | POA: Insufficient documentation

## 2015-02-01 DIAGNOSIS — E119 Type 2 diabetes mellitus without complications: Secondary | ICD-10-CM | POA: Insufficient documentation

## 2015-02-01 DIAGNOSIS — Z79899 Other long term (current) drug therapy: Secondary | ICD-10-CM | POA: Insufficient documentation

## 2015-02-01 DIAGNOSIS — R0981 Nasal congestion: Secondary | ICD-10-CM

## 2015-02-01 DIAGNOSIS — F329 Major depressive disorder, single episode, unspecified: Secondary | ICD-10-CM | POA: Insufficient documentation

## 2015-02-01 DIAGNOSIS — Z7984 Long term (current) use of oral hypoglycemic drugs: Secondary | ICD-10-CM | POA: Diagnosis not present

## 2015-02-01 DIAGNOSIS — R0602 Shortness of breath: Secondary | ICD-10-CM | POA: Diagnosis not present

## 2015-02-01 DIAGNOSIS — R1110 Vomiting, unspecified: Secondary | ICD-10-CM | POA: Insufficient documentation

## 2015-02-01 DIAGNOSIS — R058 Other specified cough: Secondary | ICD-10-CM

## 2015-02-01 DIAGNOSIS — Z8701 Personal history of pneumonia (recurrent): Secondary | ICD-10-CM | POA: Insufficient documentation

## 2015-02-01 DIAGNOSIS — F17210 Nicotine dependence, cigarettes, uncomplicated: Secondary | ICD-10-CM | POA: Insufficient documentation

## 2015-02-01 DIAGNOSIS — R0989 Other specified symptoms and signs involving the circulatory and respiratory systems: Secondary | ICD-10-CM | POA: Insufficient documentation

## 2015-02-01 DIAGNOSIS — M25512 Pain in left shoulder: Secondary | ICD-10-CM | POA: Diagnosis not present

## 2015-02-01 DIAGNOSIS — Z87828 Personal history of other (healed) physical injury and trauma: Secondary | ICD-10-CM | POA: Insufficient documentation

## 2015-02-01 DIAGNOSIS — H9209 Otalgia, unspecified ear: Secondary | ICD-10-CM | POA: Diagnosis not present

## 2015-02-01 LAB — I-STAT TROPONIN, ED: Troponin i, poc: 0 ng/mL (ref 0.00–0.08)

## 2015-02-01 LAB — CBC
HCT: 43.3 % (ref 36.0–46.0)
Hemoglobin: 14.7 g/dL (ref 12.0–15.0)
MCH: 31.8 pg (ref 26.0–34.0)
MCHC: 33.9 g/dL (ref 30.0–36.0)
MCV: 93.7 fL (ref 78.0–100.0)
Platelets: 238 10*3/uL (ref 150–400)
RBC: 4.62 MIL/uL (ref 3.87–5.11)
RDW: 12.5 % (ref 11.5–15.5)
WBC: 8.2 10*3/uL (ref 4.0–10.5)

## 2015-02-01 LAB — BASIC METABOLIC PANEL
Anion gap: 7 (ref 5–15)
BUN: 5 mg/dL — ABNORMAL LOW (ref 6–20)
CO2: 22 mmol/L (ref 22–32)
Calcium: 8.8 mg/dL — ABNORMAL LOW (ref 8.9–10.3)
Chloride: 105 mmol/L (ref 101–111)
Creatinine, Ser: 0.8 mg/dL (ref 0.44–1.00)
GFR calc Af Amer: >60 mL/min (ref >60)
GFR calc non Af Amer: >60 mL/min (ref >60)
Glucose, Bld: 222 mg/dL — ABNORMAL HIGH (ref 65–99)
Potassium: 4.1 mmol/L (ref 3.5–5.1)
Sodium: 134 mmol/L — ABNORMAL LOW (ref 135–145)

## 2015-02-01 MED ORDER — PROMETHAZINE-DM 6.25-15 MG/5ML PO SYRP: 5.0000 mL | ORAL_SOLUTION | Freq: Four times a day (QID) | ORAL | Status: DC | PRN

## 2015-02-01 MED ORDER — GUAIFENESIN 100 MG/5ML PO LIQD: 100.0000 mg | ORAL | Status: DC | PRN

## 2015-02-01 MED ORDER — AZITHROMYCIN 250 MG PO TABS: ORAL_TABLET | ORAL | Status: DC

## 2015-02-01 NOTE — ED Notes (Signed)
C/o productive cough with yellow phlegm and sob x 3 weeks.  Also reports L shoulder pain x 2 weeks.  No known injury.

## 2015-02-01 NOTE — Discharge Instructions (Signed)
Your symptoms are likely from an upper respiratory infection.  Please take medications prescribed and follow up with your doctor for further care.    Cough, Adult Coughing is a reflex that clears your throat and your airways. Coughing helps to heal and protect your lungs. It is normal to cough occasionally, but a cough that happens with other symptoms or lasts a long time may be a sign of a condition that needs treatment. A cough may last only 2-3 weeks (acute), or it may last longer than 8 weeks (chronic). CAUSES Coughing is commonly caused by:  Breathing in substances that irritate your lungs.  A viral or bacterial respiratory infection.  Allergies.  Asthma.  Postnasal drip.  Smoking.  Acid backing up from the stomach into the esophagus (gastroesophageal reflux).  Certain medicines.  Chronic lung problems, including COPD (or rarely, lung cancer).  Other medical conditions such as heart failure. HOME CARE INSTRUCTIONS  Pay attention to any changes in your symptoms. Take these actions to help with your discomfort:  Take medicines only as told by your health care provider.  If you were prescribed an antibiotic medicine, take it as told by your health care provider. Do not stop taking the antibiotic even if you start to feel better.  Talk with your health care provider before you take a cough suppressant medicine.  Drink enough fluid to keep your urine clear or pale yellow.  If the air is dry, use a cold steam vaporizer or humidifier in your bedroom or your home to help loosen secretions.  Avoid anything that causes you to cough at work or at home.  If your cough is worse at night, try sleeping in a semi-upright position.  Avoid cigarette smoke. If you smoke, quit smoking. If you need help quitting, ask your health care provider.  Avoid caffeine.  Avoid alcohol.  Rest as needed. SEEK MEDICAL CARE IF:   You have new symptoms.  You cough up pus.  Your cough does  not get better after 2-3 weeks, or your cough gets worse.  You cannot control your cough with suppressant medicines and you are losing sleep.  You develop pain that is getting worse or pain that is not controlled with pain medicines.  You have a fever.  You have unexplained weight loss.  You have night sweats. SEEK IMMEDIATE MEDICAL CARE IF:  You cough up blood.  You have difficulty breathing.  Your heartbeat is very fast.   This information is not intended to replace advice given to you by your health care provider. Make sure you discuss any questions you have with your health care provider.   Document Released: 06/22/2010 Document Revised: 09/14/2014 Document Reviewed: 03/02/2014 Elsevier Interactive Patient Education [employer]

## 2015-02-01 NOTE — ED Provider Notes (Signed)
CSN: [id number]     Arrival date & time 02/01/15  1616 History   First MD Initiated Contact with Patient 02/01/15 1839     Chief Complaint  Patient presents with  . Cough  . Shoulder Pain  . Shortness of Breath     (Consider location/radiation/quality/duration/timing/severity/associated sxs/prior Treatment) HPI   50 year old female with history of non-insulin-dependent diabetes, seasonal allergies, GERD who presents with complaints of flu-like sxs. For the past 3 weeks pt has had persistent cough, congestion, ear pain, URI sxs.  No flu shot this year.  Has pneumonia vaccine last year.  Recent sick contact.  Pt is a smoker, remote hx of asthma.  C/o L shoulder pain worse with coughing for the past few weeks. Cough productive of yellow mucus. Report post tussive emesis once yesterday.  Report subjective fever.  Report some SOB without chest pain.  Has been taking tylenol and ibuprofen and Nyquil without adequate relief.  Denies taking BP medications.  No prior hx of PE/DVT or cardiac disease.  Does have a PCP.    Past Medical History  Diagnosis Date  . Seasonal allergies   . Diabetes mellitus     NIDDM  . Meniscus tear 04/2011    right knee  . Depression     currently on meds  . GERD (gastroesophageal reflux disease)     treats w/OTC meds  . Wears glasses   . Pneumonia    Past Surgical History  Procedure Laterality Date  . No past surgeries    . Knee arthroscopy  04/17/2011    Procedure: ARTHROSCOPY KNEE;  Surgeon: Sherri Rad, MD;  Location: [facility];  Service: Orthopedics;  Laterality: Right;  Removal of Plica and Medial Menical Debridement  . Esophagogastroduodenoscopy  01/09/2012    Procedure: ESOPHAGOGASTRODUODENOSCOPY (EGD);  Surgeon: Barrie Folk, MD;  Location: [facility];  Service: Endoscopy;  Laterality: N/A;  . Ankle surgery    . Orif ankle fracture Left 10/01/2013    Procedure: OPEN REDUCTION INTERNAL FIXATION (ORIF) LEFT ANKLE FRACTURE;  Surgeon:  Cheral Almas, MD;  Location: [facility];  Service: Orthopedics;  Laterality: Left;   Family History  Problem Relation Age of Onset  . Cancer - Other Mother   . Seizures Father   . Cancer - Lung Other    Social History  Substance Use Topics  . Smoking status: Current Every Day Smoker -- 0.50 packs/day for 15 years    Types: Cigarettes  . Smokeless tobacco: Never Used     Comment: 5 cig./day  . Alcohol Use: Yes     Comment: " social beer"   OB History    No data available     Review of Systems  All other systems reviewed and are negative.     Allergies  Review of patient's allergies indicates no known allergies.  Home Medications   Prior to Admission medications   Medication Sig Start Date End Date Taking? Authorizing Provider  ARIPiprazole (ABILIFY) 10 MG tablet Take 10 mg by mouth daily.   Yes Historical Provider, MD  metFORMIN (GLUCOPHAGE) 500 MG tablet Take 1 tablet (500 mg total) by mouth daily with breakfast. 11/21/14  Yes Mady Gemma, PA-C  pantoprazole (PROTONIX) 40 MG tablet Take 40 mg by mouth daily.   Yes Historical Provider, MD  aspirin EC 325 MG tablet Take 1 tablet (325 mg total) by mouth 2 (two) times daily. Patient not taking: Reported on 06/24/2014 10/01/13   Tarry Kos, MD  HYDROcodone-acetaminophen (NORCO/VICODIN) 5-325 MG per tablet Take 2 tablets by mouth 2 (two) times daily as needed for moderate pain or severe pain. Patient not taking: Reported on 06/24/2014 09/19/13   Tomasita Crumble, MD  oxyCODONE (OXY IR/ROXICODONE) 5 MG immediate release tablet Take 1-3 tablets (5-15 mg total) by mouth every 4 (four) hours as needed. Patient not taking: Reported on 06/24/2014 10/01/13   Tarry Kos, MD  promethazine (PHENERGAN) 25 MG tablet Take 1 tablet (25 mg total) by mouth every 6 (six) hours as needed for nausea. Patient not taking: Reported on 06/24/2014 10/01/13   Tarry Kos, MD  senna-docusate (SENOKOT S) 8.6-50 MG per tablet Take 1 tablet by mouth at  bedtime as needed. Patient not taking: Reported on 06/24/2014 10/01/13   Tarry Kos, MD   BP 126/89 mmHg  Pulse 87  Temp(Src) 98.2 F (36.8 C) (Oral)  Resp 23  SpO2 93%  LMP 12/26/2014 Physical Exam  Constitutional: She appears well-developed and well-nourished. No distress.  HENT:  Head: Atraumatic.  Right Ear: External ear normal.  Left Ear: External ear normal.  Nose: Nose normal.  Mouth/Throat: Oropharynx is clear and moist. No oropharyngeal exudate.  Eyes: Conjunctivae are normal.  Neck: Normal range of motion. Neck supple.  Cardiovascular: Normal rate and regular rhythm.   Pulmonary/Chest: Effort normal and breath sounds normal. No respiratory distress.  Faint rhonchi without wheezes or rales  Abdominal: Soft. There is no tenderness.  Musculoskeletal: She exhibits tenderness (Tenderness throughout left shoulder on palpation without overlying skin changes of decreased range of motion. No gross deformity.). She exhibits no edema.  Lymphadenopathy:    She has no cervical adenopathy.  Neurological: She is alert.  Skin: No rash noted.  Psychiatric: She has a normal mood and affect.  Nursing note and vitals reviewed.   ED Course  Procedures (including critical care time) Labs Review Labs Reviewed  BASIC METABOLIC PANEL - Abnormal; Notable for the following:    Sodium 134 (*)    Glucose, Bld 222 (*)    BUN 5 (*)    Calcium 8.8 (*)    All other components within normal limits  CBC  I-STAT TROPOININ, ED    Imaging Review Dg Chest 2 View  02/01/2015  CLINICAL DATA:  Cough and congestion with fever for 3 weeks. Shortness of breath for 2 days EXAM: CHEST  2 VIEW COMPARISON:  March 27, 2013 FINDINGS: Lungs are clear. Heart size and pulmonary vascularity are normal. No adenopathy. No pneumothorax. No bone lesions. IMPRESSION: No edema or consolidation. Electronically Signed   By: Bretta Bang III M.D.   On: 02/01/2015 16:41   I have personally reviewed and evaluated  these images and lab results as part of my medical decision-making.   EKG Interpretation None     ED ECG REPORT   Date: 02/01/2015  Rate: 90  Rhythm: normal sinus rhythm  QRS Axis: normal  Intervals: normal  ST/T Wave abnormalities: nonspecific ST/T changes  Conduction Disutrbances:none  Narrative Interpretation:   Old EKG Reviewed: unchanged  I have personally reviewed the EKG tracing and agree with the computerized printout as noted.   MDM   Final diagnoses:  Productive cough  Congestion of nasal sinus    BP 126/89 mmHg  Pulse 87  Temp(Src) 98.2 F (36.8 C) (Oral)  Resp 23  SpO2 93%  LMP 12/26/2014   7:55 PM Patient presents with URI symptoms however due to the prolonged duration and a history of tobacco abuse diabetes  I feel patient may benefit from a short course of Z-Pak. Her chest x-ray shows no acute pneumonia. The symptom is not consistent with ACS. Low suspicion for PE. Patient will follow-up with PCP for further care. Return precautions discussed.  Fayrene Helper, PA-C 02/01/15 2002  Linwood Dibbles, MD 02/01/15 2003

## 2015-09-18 ENCOUNTER — Encounter (HOSPITAL_COMMUNITY): Payer: Self-pay | Admitting: Pharmacy Technician

## 2015-09-18 ENCOUNTER — Emergency Department (HOSPITAL_COMMUNITY)
Admission: EM | Admit: 2015-09-18 | Discharge: 2015-09-18 | Disposition: A | Discharge status: Home / Self Care | Payer: Medicaid Other | Attending: Emergency Medicine | Admitting: Emergency Medicine

## 2015-09-18 ENCOUNTER — Emergency Department (HOSPITAL_COMMUNITY): Payer: Medicaid Other

## 2015-09-18 DIAGNOSIS — Z7984 Long term (current) use of oral hypoglycemic drugs: Secondary | ICD-10-CM | POA: Insufficient documentation

## 2015-09-18 DIAGNOSIS — R05 Cough: Secondary | ICD-10-CM | POA: Diagnosis present

## 2015-09-18 DIAGNOSIS — Z760 Encounter for issue of repeat prescription: Secondary | ICD-10-CM | POA: Diagnosis not present

## 2015-09-18 DIAGNOSIS — F17210 Nicotine dependence, cigarettes, uncomplicated: Secondary | ICD-10-CM | POA: Diagnosis not present

## 2015-09-18 DIAGNOSIS — Z7982 Long term (current) use of aspirin: Secondary | ICD-10-CM | POA: Insufficient documentation

## 2015-09-18 DIAGNOSIS — J069 Acute upper respiratory infection, unspecified: Secondary | ICD-10-CM | POA: Insufficient documentation

## 2015-09-18 DIAGNOSIS — E119 Type 2 diabetes mellitus without complications: Secondary | ICD-10-CM | POA: Insufficient documentation

## 2015-09-18 MED ORDER — ALBUTEROL SULFATE HFA 108 (90 BASE) MCG/ACT IN AERS: 1.0000 | INHALATION_SPRAY | Freq: Four times a day (QID) | RESPIRATORY_TRACT | 0 refills | Status: AC | PRN

## 2015-09-18 MED ORDER — OMEPRAZOLE 20 MG PO CPDR: 20.0000 mg | DELAYED_RELEASE_CAPSULE | Freq: Every day | ORAL | 0 refills | Status: DC

## 2015-09-18 MED ORDER — METFORMIN HCL 500 MG PO TABS: 500.0000 mg | ORAL_TABLET | Freq: Every day | ORAL | 0 refills | Status: DC

## 2015-09-18 MED ORDER — KETOROLAC TROMETHAMINE 30 MG/ML IJ SOLN
30.0000 mg | Freq: Once | INTRAMUSCULAR | Status: AC
  Administered 2015-09-18: 30 mg via INTRAMUSCULAR
  Filled 2015-09-18: qty 1

## 2015-09-18 MED ORDER — PROMETHAZINE-DM 6.25-15 MG/5ML PO SYRP
5.0000 mL | ORAL_SOLUTION | Freq: Four times a day (QID) | ORAL | 0 refills | Status: DC | PRN
Start: 2015-09-18 — End: 2019-06-02

## 2015-09-18 MED ORDER — CETIRIZINE-PSEUDOEPHEDRINE ER 5-120 MG PO TB12: 1.0000 | ORAL_TABLET | Freq: Every day | ORAL | 0 refills | Status: DC | PRN

## 2015-09-18 MED ORDER — IBUPROFEN 600 MG PO TABS: 600.0000 mg | ORAL_TABLET | Freq: Four times a day (QID) | ORAL | 0 refills | Status: DC | PRN

## 2015-09-18 NOTE — Discharge Instructions (Signed)
Your chest x-ray showed some bronchitic changes with no evidence of pneumonia. I gave you a few prescriptions to take as needed to help with your symptoms. Drink plenty of fluids to stay hydrated.  Per your request, I also gave you a two-week refill of your prilosec and metformin. Please follow up with your primary care provider as soon as possible (next week as scheduled).

## 2015-09-18 NOTE — ED Provider Notes (Signed)
[facility]-EMERGENCY DEPT Provider Note   CSN: [id number] Arrival date & time: 09/18/15  1546  By signing my name below, I, Clovis PuAvnee Patel, attest that this documentation has been prepared under the direction and in the presence of  Emagene Merfeld, [place]-C. Electronically Signed: Clovis PuAvnee Patel, ED Scribe. 09/18/15. 5:23 PM.    History   Chief Complaint Chief Complaint  Patient presents with  . Cough  . Nasal Congestion    The history is provided by the patient. No language interpreter was used.    HPI Comments:  Angel Mendez is a 50 y.o. female, with a hx of DM and GERD, who presents to the Emergency Department complaining of gradually worsening, moderate sore throat onset 4.5 days ago. Pt states she has associated congestion, body aches, chills, and diarrhea. She notes the first night she had a dry cough and rhinorrhea and the symptoms worsened with time. Pt states she has also been feeling feverish but has not taken her temperature. Pt has taken Tylenol, generic mucinex, and "cold gel caps" with little relief. She denies nausea or abdominal pain. Pt denies any sick contact. Pt also needs a prescription for metformin and Prilosec. States she has f/u with PCP next week.   Past Medical History:  Diagnosis Date  . Depression    currently on meds  . Diabetes mellitus    NIDDM  . GERD (gastroesophageal reflux disease)    treats w/OTC meds  . Meniscus tear 04/2011   right knee  . Pneumonia   . Seasonal allergies   . Wears glasses     Patient Active Problem List   Diagnosis Date Noted  . Bimalleolar ankle fracture 10/01/2013  . Substance abuse 01/12/2012  . Suicide attempt (HCC) 01/08/2012  . Depression 01/08/2012  . Diabetes mellitus, type II (HCC) 01/08/2012    Past Surgical History:  Procedure Laterality Date  . ANKLE SURGERY    . ESOPHAGOGASTRODUODENOSCOPY  01/09/2012   Procedure: ESOPHAGOGASTRODUODENOSCOPY (EGD);  Surgeon: Barrie FolkJohn C Hayes, MD;  [facility];   Service: Endoscopy;  Laterality: N/A;  . KNEE ARTHROSCOPY  04/17/2011   Procedure: ARTHROSCOPY KNEE;  Surgeon: Sherri RadPaul A Bednarz, MD;  Location: [facility];  Service: Orthopedics;  Laterality: Right;  Removal of Plica and Medial Menical Debridement  . NO PAST SURGERIES    . ORIF ANKLE FRACTURE Left 10/01/2013   Procedure: OPEN REDUCTION INTERNAL FIXATION (ORIF) LEFT ANKLE FRACTURE;  Surgeon: Cheral AlmasNaiping Michael Xu, MD;  Location: [facility] OR;  Service: Orthopedics;  Laterality: Left;    OB History    No data available       Home Medications    Prior to Admission medications   Medication Sig Start Date End Date Taking? Authorizing Provider  ARIPiprazole (ABILIFY) 10 MG tablet Take 10 mg by mouth daily.    Historical Provider, MD  aspirin EC 325 MG tablet Take 1 tablet (325 mg total) by mouth 2 (two) times daily. Patient not taking: Reported on 06/24/2014 10/01/13   Tarry KosNaiping M Xu, MD  azithromycin (ZITHROMAX Z-PAK) 250 MG tablet 2 po day one, then 1 daily x 4 days 02/01/15   Fayrene HelperBowie Tran, PA-C  guaiFENesin (ROBITUSSIN) 100 MG/5ML liquid Take 5-10 mLs (100-200 mg total) by mouth every 4 (four) hours as needed for congestion. 02/01/15   Fayrene HelperBowie Tran, PA-C  metFORMIN (GLUCOPHAGE) 500 MG tablet Take 1 tablet (500 mg total) by mouth daily with breakfast. 11/21/14   Mady GemmaElizabeth C Westfall, PA-C  pantoprazole (PROTONIX) 40 MG tablet Take  40 mg by mouth daily.    Historical Provider, MD  promethazine (PHENERGAN) 25 MG tablet Take 1 tablet (25 mg total) by mouth every 6 (six) hours as needed for nausea. Patient not taking: Reported on 06/24/2014 10/01/13   Tarry Kos, MD  promethazine-dextromethorphan (PROMETHAZINE-DM) 6.25-15 MG/5ML syrup Take 5 mLs by mouth 4 (four) times daily as needed for cough. 02/01/15   Fayrene Helper, PA-C  senna-docusate (SENOKOT S) 8.6-50 MG per tablet Take 1 tablet by mouth at bedtime as needed. Patient not taking: Reported on 06/24/2014 10/01/13   Tarry Kos, MD    Family  History Family History  Problem Relation Age of Onset  . Cancer - Other Mother   . Seizures Father   . Cancer - Lung Other     Social History Social History  Substance Use Topics  . Smoking status: Current Every Day Smoker    Packs/day: 0.50    Years: 15.00    Types: Cigarettes  . Smokeless tobacco: Never Used     Comment: 5 cig./day  . Alcohol use Yes     Comment: " social beer"     Allergies   Review of patient's allergies indicates no known allergies.   Review of Systems Review of Systems 10 systems reviewed and all are negative for acute change except as noted in the HPI.    Physical Exam Updated Vital Signs BP (!) 153/116 (BP Location: Left Arm)   Pulse 82   Temp 98.1 F (36.7 C) (Oral)   Resp 18   Ht 5\' 7"  (1.702 m)   Wt 220 lb (99.8 kg)   SpO2 97%   BMI 34.46 kg/m   Physical Exam  Constitutional: She appears well-developed and well-nourished. No distress.  HENT:  Head: Normocephalic and atraumatic.  Mucous membranes moist  No posterior oropharyngeal edema or erythema  Voice is hoarse. No cervical lymphadenopathy  Bilateral nasal congestion. Ears normal bilaterally.   Eyes: Conjunctivae and EOM are normal. Pupils are equal, round, and reactive to light.  Neck: Neck supple.  Cardiovascular: Normal rate, regular rhythm and normal heart sounds.   Pulmonary/Chest: Effort normal.  Lungs CTAB  Neurological: She is alert.  Skin: She is not diaphoretic.  Nursing note and vitals reviewed.   ED Treatments / Results  DIAGNOSTIC STUDIES:  Oxygen Saturation is 97% on RA, normal by my interpretation.    COORDINATION OF CARE:  5:00 PM Discussed treatment plan with pt at bedside and pt agreed to plan.  Labs (all labs ordered are listed, but only abnormal results are displayed) Labs Reviewed - No data to display  EKG  EKG Interpretation None       Radiology Dg Chest 2 View  Result Date: 09/18/2015 CLINICAL DATA:  Cough and congestion for 5  days. History of pneumonia, diabetes and smoker. EXAM: CHEST  2 VIEW COMPARISON:  Chest radiograph February 01, 2015 FINDINGS: Cardiomediastinal silhouette is normal. Similar mild bronchitic changes. No pleural effusions or focal consolidations. Trachea projects midline and there is no pneumothorax. Soft tissue planes and included osseous structures are non-suspicious. IMPRESSION: Similar mild bronchitic changes without focal consolidation. Electronically Signed   By: Awilda Metro M.D.   On: 09/18/2015 17:57    Procedures Procedures (including critical care time)  Medications Ordered in ED Medications  ketorolac (TORADOL) 30 MG/ML injection 30 mg (30 mg Intramuscular Given 09/18/15 1753)     Initial Impression / Assessment and Plan / ED Course  I have reviewed the triage vital  signs and the nursing notes.  Pertinent labs & imaging results that were available during my care of the patient were reviewed by me and considered in my medical decision making (see chart for details).  Clinical Course    XR with mild bronchitic changes otherwise negative. Likely viral URI. She is nontoxic appearing and in NAD. Hemodynamically stable. Will give rx for supportive meds. She has PCP follow up appointment next week. 2 week prescriptions given for metformin and prilosec. The patient appears reasonably screened and/or stabilized for discharge and I doubt any other medical condition or other [facility] requiring further screening, evaluation, or treatment in the ED at this time prior to discharge.   Final Clinical Impressions(s) / ED Diagnoses   Final diagnoses:  URI (upper respiratory infection)  Encounter for medication refill    New Prescriptions Discharge Medication List as of 09/18/2015  6:10 PM    START taking these medications   Details  albuterol (PROVENTIL HFA;VENTOLIN HFA) 108 (90 Base) MCG/ACT inhaler Inhale 1-2 puffs into the lungs every 6 (six) hours as needed for wheezing or shortness of  breath., Starting Mon 09/18/2015, Print    cetirizine-pseudoephedrine (ZYRTEC-D) 5-120 MG tablet Take 1 tablet by mouth daily as needed (runny/stuffy nose)., Starting Mon 09/18/2015, Print    ibuprofen (ADVIL,MOTRIN) 600 MG tablet Take 1 tablet (600 mg total) by mouth every 6 (six) hours as needed., Starting Mon 09/18/2015, Print    !! metFORMIN (GLUCOPHAGE) 500 MG tablet Take 1 tablet (500 mg total) by mouth daily with breakfast., Starting Mon 09/18/2015, Print    omeprazole (PRILOSEC) 20 MG capsule Take 1 capsule (20 mg total) by mouth daily., Starting Mon 09/18/2015, Print    !! promethazine-dextromethorphan (PROMETHAZINE-DM) 6.25-15 MG/5ML syrup Take 5 mLs by mouth 4 (four) times daily as needed for cough., Starting Mon 09/18/2015, Print     !! - Potential duplicate medications found. Please discuss with provider.    I personally performed the services described in this documentation, which was scribed in my presence. The recorded information has been reviewed and is accurate.    Carlene Coria, PA-C 09/18/15 1945    Shaune Pollack, MD 09/19/15 1314

## 2015-09-18 NOTE — ED Triage Notes (Signed)
Patient arrives to the ed with complaints of sore throat, productive cough and congestion X4 days. No relief with OTC medications.

## 2016-02-01 ENCOUNTER — Emergency Department (HOSPITAL_COMMUNITY)
Admission: EM | Admit: 2016-02-01 | Discharge: 2016-02-01 | Disposition: A | Discharge status: Home / Self Care | Payer: Medicaid Other | Attending: Emergency Medicine | Admitting: Emergency Medicine

## 2016-02-01 ENCOUNTER — Emergency Department (HOSPITAL_COMMUNITY): Payer: Medicaid Other

## 2016-02-01 ENCOUNTER — Other Ambulatory Visit: Payer: Self-pay

## 2016-02-01 ENCOUNTER — Encounter (HOSPITAL_COMMUNITY): Payer: Self-pay | Admitting: Emergency Medicine

## 2016-02-01 DIAGNOSIS — E119 Type 2 diabetes mellitus without complications: Secondary | ICD-10-CM | POA: Diagnosis not present

## 2016-02-01 DIAGNOSIS — R079 Chest pain, unspecified: Secondary | ICD-10-CM | POA: Insufficient documentation

## 2016-02-01 DIAGNOSIS — F17210 Nicotine dependence, cigarettes, uncomplicated: Secondary | ICD-10-CM | POA: Insufficient documentation

## 2016-02-01 DIAGNOSIS — Z5321 Procedure and treatment not carried out due to patient leaving prior to being seen by health care provider: Secondary | ICD-10-CM | POA: Diagnosis not present

## 2016-02-01 LAB — CBC WITH DIFFERENTIAL/PLATELET
Basophils Absolute: 0.1 10*3/uL (ref 0.0–0.1)
Basophils Relative: 1 %
Eosinophils Absolute: 0.2 10*3/uL (ref 0.0–0.7)
Eosinophils Relative: 2 %
HCT: 41.8 % (ref 36.0–46.0)
Hemoglobin: 14.2 g/dL (ref 12.0–15.0)
Lymphocytes Relative: 51 %
Lymphs Abs: 5 10*3/uL — ABNORMAL HIGH (ref 0.7–4.0)
MCH: 32 pg (ref 26.0–34.0)
MCHC: 34 g/dL (ref 30.0–36.0)
MCV: 94.1 fL (ref 78.0–100.0)
Monocytes Absolute: 0.5 10*3/uL (ref 0.1–1.0)
Monocytes Relative: 5 %
Neutro Abs: 4 10*3/uL (ref 1.7–7.7)
Neutrophils Relative %: 41 %
Platelets: 266 10*3/uL (ref 150–400)
RBC: 4.44 MIL/uL (ref 3.87–5.11)
RDW: 13.4 % (ref 11.5–15.5)
WBC: 9.7 10*3/uL (ref 4.0–10.5)

## 2016-02-01 LAB — URINALYSIS, ROUTINE W REFLEX MICROSCOPIC
Bilirubin Urine: NEGATIVE
Glucose, UA: NEGATIVE mg/dL
Ketones, ur: NEGATIVE mg/dL
Leukocytes, UA: NEGATIVE
Nitrite: NEGATIVE
Protein, ur: NEGATIVE mg/dL
Specific Gravity, Urine: 1.004 — ABNORMAL LOW (ref 1.005–1.030)
pH: 5 (ref 5.0–8.0)

## 2016-02-01 LAB — COMPREHENSIVE METABOLIC PANEL
ALT: 37 U/L (ref 14–54)
AST: 50 U/L — ABNORMAL HIGH (ref 15–41)
Albumin: 3.6 g/dL (ref 3.5–5.0)
Alkaline Phosphatase: 77 U/L (ref 38–126)
Anion gap: 13 (ref 5–15)
BUN: 7 mg/dL (ref 6–20)
CO2: 19 mmol/L — ABNORMAL LOW (ref 22–32)
Calcium: 8.7 mg/dL — ABNORMAL LOW (ref 8.9–10.3)
Chloride: 107 mmol/L (ref 101–111)
Creatinine, Ser: 0.88 mg/dL (ref 0.44–1.00)
GFR calc Af Amer: >60 mL/min (ref >60)
GFR calc non Af Amer: >60 mL/min (ref >60)
Glucose, Bld: 115 mg/dL — ABNORMAL HIGH (ref 65–99)
Potassium: 4.1 mmol/L (ref 3.5–5.1)
Sodium: 139 mmol/L (ref 135–145)
Total Bilirubin: 0.2 mg/dL — ABNORMAL LOW (ref 0.3–1.2)
Total Protein: 7.4 g/dL (ref 6.5–8.1)

## 2016-02-01 LAB — I-STAT TROPONIN, ED: Troponin i, poc: 0 ng/mL (ref 0.00–0.08)

## 2016-02-01 LAB — PREGNANCY, URINE: Preg Test, Ur: NEGATIVE

## 2016-02-01 NOTE — ED Notes (Signed)
Pt did not respond in lobby for update vitals call

## 2016-02-01 NOTE — ED Triage Notes (Addendum)
Pt st's she has been having a sharp pain under left breast into ba k for approx 3 months.  St's she is short of breath at times.  Pt denies any nausea or vomiting.  Pt talking on phone and eating french fries while in triage

## 2016-02-01 NOTE — ED Notes (Signed)
No response on second call in lobby for Pt vitals update.

## 2016-03-29 ENCOUNTER — Encounter (HOSPITAL_COMMUNITY): Payer: Self-pay | Admitting: *Deleted

## 2016-03-29 ENCOUNTER — Emergency Department (HOSPITAL_COMMUNITY): Payer: Medicaid Other

## 2016-03-29 ENCOUNTER — Emergency Department (HOSPITAL_COMMUNITY)
Admission: EM | Admit: 2016-03-29 | Discharge: 2016-03-29 | Disposition: A | Discharge status: Home / Self Care | Payer: Medicaid Other | Attending: Emergency Medicine | Admitting: Emergency Medicine

## 2016-03-29 DIAGNOSIS — N939 Abnormal uterine and vaginal bleeding, unspecified: Secondary | ICD-10-CM

## 2016-03-29 DIAGNOSIS — E119 Type 2 diabetes mellitus without complications: Secondary | ICD-10-CM | POA: Insufficient documentation

## 2016-03-29 DIAGNOSIS — Z7984 Long term (current) use of oral hypoglycemic drugs: Secondary | ICD-10-CM | POA: Insufficient documentation

## 2016-03-29 DIAGNOSIS — F17210 Nicotine dependence, cigarettes, uncomplicated: Secondary | ICD-10-CM | POA: Insufficient documentation

## 2016-03-29 DIAGNOSIS — A599 Trichomoniasis, unspecified: Secondary | ICD-10-CM | POA: Diagnosis not present

## 2016-03-29 DIAGNOSIS — N730 Acute parametritis and pelvic cellulitis: Secondary | ICD-10-CM

## 2016-03-29 DIAGNOSIS — N739 Female pelvic inflammatory disease, unspecified: Secondary | ICD-10-CM | POA: Insufficient documentation

## 2016-03-29 DIAGNOSIS — Z7982 Long term (current) use of aspirin: Secondary | ICD-10-CM | POA: Insufficient documentation

## 2016-03-29 DIAGNOSIS — N83519 Torsion of ovary and ovarian pedicle, unspecified side: Secondary | ICD-10-CM

## 2016-03-29 DIAGNOSIS — Z79899 Other long term (current) drug therapy: Secondary | ICD-10-CM | POA: Diagnosis not present

## 2016-03-29 LAB — WET PREP, GENITAL
Sperm: NONE SEEN
Yeast Wet Prep HPF POC: NONE SEEN

## 2016-03-29 LAB — I-STAT CHEM 8, ED
BUN: 7 mg/dL (ref 6–20)
Calcium, Ion: 1.17 mmol/L (ref 1.15–1.40)
Chloride: 105 mmol/L (ref 101–111)
Creatinine, Ser: 0.8 mg/dL (ref 0.44–1.00)
Glucose, Bld: 156 mg/dL — ABNORMAL HIGH (ref 65–99)
HCT: 44 % (ref 36.0–46.0)
Hemoglobin: 15 g/dL (ref 12.0–15.0)
Potassium: 3.6 mmol/L (ref 3.5–5.1)
Sodium: 139 mmol/L (ref 135–145)
TCO2: 23 mmol/L (ref 0–100)

## 2016-03-29 LAB — I-STAT BETA HCG BLOOD, ED (MC, WL, AP ONLY): I-stat hCG, quantitative: <5 m[IU]/mL (ref <5)

## 2016-03-29 MED ORDER — LIDOCAINE HCL (PF) 1 % IJ SOLN
INTRAMUSCULAR | Status: AC
  Administered 2016-03-29: 0.9 mL
  Filled 2016-03-29: qty 5

## 2016-03-29 MED ORDER — METRONIDAZOLE 500 MG PO TABS
2000.0000 mg | ORAL_TABLET | Freq: Once | ORAL | Status: AC
  Administered 2016-03-29: 2000 mg via ORAL
  Filled 2016-03-29: qty 4

## 2016-03-29 MED ORDER — DOXYCYCLINE HYCLATE 100 MG PO CAPS: 100.0000 mg | ORAL_CAPSULE | Freq: Two times a day (BID) | ORAL | 0 refills | Status: DC

## 2016-03-29 MED ORDER — METFORMIN HCL 500 MG PO TABS
500.0000 mg | ORAL_TABLET | Freq: Every day | ORAL | 0 refills | Status: DC
Start: 2016-03-29 — End: 2019-06-02

## 2016-03-29 MED ORDER — OMEPRAZOLE 20 MG PO CPDR: 20.0000 mg | DELAYED_RELEASE_CAPSULE | Freq: Every day | ORAL | 0 refills | Status: DC

## 2016-03-29 MED ORDER — DOXYCYCLINE HYCLATE 100 MG PO TABS
100.0000 mg | ORAL_TABLET | Freq: Once | ORAL | Status: AC
  Administered 2016-03-29: 100 mg via ORAL
  Filled 2016-03-29: qty 1

## 2016-03-29 MED ORDER — CEFTRIAXONE SODIUM 250 MG IJ SOLR
250.0000 mg | Freq: Once | INTRAMUSCULAR | Status: AC
  Administered 2016-03-29: 250 mg via INTRAMUSCULAR
  Filled 2016-03-29: qty 250

## 2016-03-29 NOTE — ED Notes (Signed)
Patient transported to Ultrasound 

## 2016-03-29 NOTE — ED Provider Notes (Signed)
[facility]-EMERGENCY DEPT Provider Note   CSN: [id number] Arrival date & time: 03/29/16  1124     History   Chief Complaint Chief Complaint  Patient presents with  . Vaginal Bleeding    HPI Angel Mendez is a 51 y.o. female who presents with vaginal bleeding and cramping. PMH significant for DM, depression. She reports she is sexually active and has a menstrual cycle once a month normally. She started to have a heavy amount of bleeding and severe cramps two nights ago. She is worried she is having a miscarriage because she's had one in the past and it felt similar. She has bilateral abdominal cramping L>R which radiates to the back. Today the bleeding has gotten lighter and cramping is milder. No fever, abdominal pain, N/V/D, constipation, dysuria, vaginal discharge. No hx of STI.  HPI  Past Medical History:  Diagnosis Date  . Depression    currently on meds  . Diabetes mellitus    NIDDM  . GERD (gastroesophageal reflux disease)    treats w/OTC meds  . Meniscus tear 04/2011   right knee  . Pneumonia   . Seasonal allergies   . Wears glasses     Patient Active Problem List   Diagnosis Date Noted  . Bimalleolar ankle fracture 10/01/2013  . Substance abuse 01/12/2012  . Suicide attempt 01/08/2012  . Depression 01/08/2012  . Diabetes mellitus, type II (HCC) 01/08/2012    Past Surgical History:  Procedure Laterality Date  . ANKLE SURGERY    . ESOPHAGOGASTRODUODENOSCOPY  01/09/2012   Procedure: ESOPHAGOGASTRODUODENOSCOPY (EGD);  Surgeon: Barrie Folk, MD;  Location: [facility];  Service: Endoscopy;  Laterality: N/A;  . KNEE ARTHROSCOPY  04/17/2011   Procedure: ARTHROSCOPY KNEE;  Surgeon: Sherri Rad, MD;  Location: [facility];  Service: Orthopedics;  Laterality: Right;  Removal of Plica and Medial Menical Debridement  . NO PAST SURGERIES    . ORIF ANKLE FRACTURE Left 10/01/2013   Procedure: OPEN REDUCTION INTERNAL FIXATION (ORIF) LEFT ANKLE  FRACTURE;  Surgeon: Cheral Almas, MD;  Location: [facility];  Service: Orthopedics;  Laterality: Left;    OB History    No data available       Home Medications    Prior to Admission medications   Medication Sig Start Date End Date Taking? Authorizing Provider  albuterol (PROVENTIL HFA;VENTOLIN HFA) 108 (90 Base) MCG/ACT inhaler Inhale 1-2 puffs into the lungs every 6 (six) hours as needed for wheezing or shortness of breath. 09/18/15   Ace Gins Sam, PA-C  ARIPiprazole (ABILIFY) 10 MG tablet Take 10 mg by mouth daily.    Historical Provider, MD  aspirin EC 325 MG tablet Take 1 tablet (325 mg total) by mouth 2 (two) times daily. Patient not taking: Reported on 06/24/2014 10/01/13   Tarry Kos, MD  azithromycin (ZITHROMAX Z-PAK) 250 MG tablet 2 po day one, then 1 daily x 4 days 02/01/15   Fayrene Helper, PA-C  cetirizine-pseudoephedrine (ZYRTEC-D) 5-120 MG tablet Take 1 tablet by mouth daily as needed (runny/stuffy nose). 09/18/15   Ace Gins Sam, PA-C  guaiFENesin (ROBITUSSIN) 100 MG/5ML liquid Take 5-10 mLs (100-200 mg total) by mouth every 4 (four) hours as needed for congestion. 02/01/15   Fayrene Helper, PA-C  ibuprofen (ADVIL,MOTRIN) 600 MG tablet Take 1 tablet (600 mg total) by mouth every 6 (six) hours as needed. 09/18/15   Ace Gins Sam, PA-C  metFORMIN (GLUCOPHAGE) 500 MG tablet Take 1 tablet (500 mg total) by mouth daily  with breakfast. 11/21/14   Mady GemmaElizabeth C Westfall, PA-C  metFORMIN (GLUCOPHAGE) 500 MG tablet Take 1 tablet (500 mg total) by mouth daily with breakfast. 09/18/15   Ace GinsSerena Y Sam, PA-C  omeprazole (PRILOSEC) 20 MG capsule Take 1 capsule (20 mg total) by mouth daily. 09/18/15   Ace GinsSerena Y Sam, PA-C  pantoprazole (PROTONIX) 40 MG tablet Take 40 mg by mouth daily.    Historical Provider, MD  promethazine (PHENERGAN) 25 MG tablet Take 1 tablet (25 mg total) by mouth every 6 (six) hours as needed for nausea. Patient not taking: Reported on 06/24/2014 10/01/13   Tarry KosNaiping M Xu, MD    promethazine-dextromethorphan (PROMETHAZINE-DM) 6.25-15 MG/5ML syrup Take 5 mLs by mouth 4 (four) times daily as needed for cough. 02/01/15   Fayrene HelperBowie Tran, PA-C  promethazine-dextromethorphan (PROMETHAZINE-DM) 6.25-15 MG/5ML syrup Take 5 mLs by mouth 4 (four) times daily as needed for cough. 09/18/15   Ace GinsSerena Y Sam, PA-C  senna-docusate (SENOKOT S) 8.6-50 MG per tablet Take 1 tablet by mouth at bedtime as needed. Patient not taking: Reported on 06/24/2014 10/01/13   Tarry KosNaiping M Xu, MD    Family History Family History  Problem Relation Age of Onset  . Cancer - Other Mother   . Seizures Father   . Cancer - Lung Other     Social History Social History  Substance Use Topics  . Smoking status: Current Every Day Smoker    Packs/day: 0.50    Years: 15.00    Types: Cigarettes  . Smokeless tobacco: Never Used     Comment: 5 cig./day  . Alcohol use Yes     Comment: " social beer"     Allergies   Patient has no known allergies.   Review of Systems Review of Systems  Constitutional: Negative for chills and fever.  Gastrointestinal: Negative for abdominal pain, constipation, diarrhea, nausea and vomiting.  Genitourinary: Positive for pelvic pain and vaginal bleeding. Negative for dysuria, hematuria and vaginal discharge.  Musculoskeletal: Positive for back pain.  All other systems reviewed and are negative.    Physical Exam Updated Vital Signs BP 129/89 (BP Location: Left Arm)   Pulse 77   Temp 97.9 F (36.6 C) (Oral)   Resp 20   Ht 5\' 7"  (1.702 m)   Wt 99.8 kg   LMP 03/26/2016   SpO2 100%   BMI 34.46 kg/m   Physical Exam  Constitutional: She is oriented to person, place, and time. She appears well-developed and well-nourished. No distress.  HENT:  Head: Normocephalic and atraumatic.  Eyes: Conjunctivae are normal. Pupils are equal, round, and reactive to light. Right eye exhibits no discharge. Left eye exhibits no discharge. No scleral icterus.  Neck: Normal range of  motion.  Cardiovascular: Normal rate and regular rhythm.  Exam reveals no gallop and no friction rub.   No murmur heard. Pulmonary/Chest: Effort normal and breath sounds normal. No respiratory distress. She has no wheezes. She has no rales. She exhibits no tenderness.  Abdominal: Soft. Bowel sounds are normal. She exhibits no distension and no mass. There is no tenderness. There is no rebound and no guarding. No hernia.  Genitourinary:  Genitourinary Comments: Pelvic: No inguinal lymphadenopathy or inguinal hernia noted. Normal external genitalia. No pain with speculum insertion. Closed cervical os with normal appearance - no rash or lesions. Moderate amount of bright red blood coming from cervix and in vaginal vault. On bimanual examination She has L sided adnexal tenderness. No cervical motion tenderness. Chaperone present during exam.  Neurological: She is alert and oriented to person, place, and time.  Skin: Skin is warm and dry.  Psychiatric: She has a normal mood and affect. Her behavior is normal.  Nursing note and vitals reviewed.    ED Treatments / Results  Labs (all labs ordered are listed, but only abnormal results are displayed) Labs Reviewed  WET PREP, GENITAL - Abnormal; Notable for the following:       Result Value   Trich, Wet Prep PRESENT (*)    Clue Cells Wet Prep HPF POC PRESENT (*)    WBC, Wet Prep HPF POC MODERATE (*)    All other components within normal limits  I-STAT CHEM 8, ED - Abnormal; Notable for the following:    Glucose, Bld 156 (*)    All other components within normal limits  I-STAT BETA HCG BLOOD, ED ([facility], [facility], [facility] ONLY)  GC/CHLAMYDIA PROBE AMP ([place]) NOT AT [facility]    EKG  EKG Interpretation None       Radiology US Transvaginal Non-ob  Result Date: 03/29/2016 CLINICAL DATA:  Patient with lower pelvic pain, vaginal bleeding. EXAM: TRANSABDOMINAL AND TRANSVAGINAL ULTRASOUND OF PELVIS DOPPLER ULTRASOUND OF OVARIES TECHNIQUE: Both  transabdominal and transvaginal ultrasound examinations of the pelvis were performed. Transabdominal technique was performed for global imaging of the pelvis including uterus, ovaries, adnexal regions, and pelvic cul-de-sac. It was necessary to proceed with endovaginal exam following the transabdominal exam to visualize the adnexal structures. Color and duplex Doppler ultrasound was utilized to evaluate blood flow to the ovaries. COMPARISON:  CT abdomen pelvis 06/24/2014 FINDINGS: Uterus Measurements: 7.8 x 4.1 x 5.4 cm. No fibroids or other mass visualized. Endometrium Thickness: 2 mm.  No focal abnormality visualized. Right ovary Measurements: 3.6 x 1.4 x 2.3 cm. Normal appearance/no adnexal mass. Left ovary Measurements: 3.3 x 1.6 x 2.5 cm. Normal appearance/no adnexal mass. Pulsed Doppler evaluation of both ovaries demonstrates normal low-resistance arterial and venous waveforms. Other findings No abnormal free fluid. IMPRESSION: Normal sonographic appearance of the ovaries bilaterally without evidence to suggest torsion. Unremarkable appearance of the uterus. No significant pelvic free fluid. Electronically Signed   By: Annia Belt M.D.   On: 03/29/2016 14:52   US Pelvis Complete  Result Date: 03/29/2016 CLINICAL DATA:  Patient with lower pelvic pain, vaginal bleeding. EXAM: TRANSABDOMINAL AND TRANSVAGINAL ULTRASOUND OF PELVIS DOPPLER ULTRASOUND OF OVARIES TECHNIQUE: Both transabdominal and transvaginal ultrasound examinations of the pelvis were performed. Transabdominal technique was performed for global imaging of the pelvis including uterus, ovaries, adnexal regions, and pelvic cul-de-sac. It was necessary to proceed with endovaginal exam following the transabdominal exam to visualize the adnexal structures. Color and duplex Doppler ultrasound was utilized to evaluate blood flow to the ovaries. COMPARISON:  CT abdomen pelvis 06/24/2014 FINDINGS: Uterus Measurements: 7.8 x 4.1 x 5.4 cm. No fibroids or  other mass visualized. Endometrium Thickness: 2 mm.  No focal abnormality visualized. Right ovary Measurements: 3.6 x 1.4 x 2.3 cm. Normal appearance/no adnexal mass. Left ovary Measurements: 3.3 x 1.6 x 2.5 cm. Normal appearance/no adnexal mass. Pulsed Doppler evaluation of both ovaries demonstrates normal low-resistance arterial and venous waveforms. Other findings No abnormal free fluid. IMPRESSION: Normal sonographic appearance of the ovaries bilaterally without evidence to suggest torsion. Unremarkable appearance of the uterus. No significant pelvic free fluid. Electronically Signed   By: Annia Belt M.D.   On: 03/29/2016 14:52   [place] Art/ven Flow Abd Pelv Doppler  Result Date: 03/29/2016 CLINICAL DATA:  Patient with lower pelvic  pain, vaginal bleeding. EXAM: TRANSABDOMINAL AND TRANSVAGINAL ULTRASOUND OF PELVIS DOPPLER ULTRASOUND OF OVARIES TECHNIQUE: Both transabdominal and transvaginal ultrasound examinations of the pelvis were performed. Transabdominal technique was performed for global imaging of the pelvis including uterus, ovaries, adnexal regions, and pelvic cul-de-sac. It was necessary to proceed with endovaginal exam following the transabdominal exam to visualize the adnexal structures. Color and duplex Doppler ultrasound was utilized to evaluate blood flow to the ovaries. COMPARISON:  CT abdomen pelvis 06/24/2014 FINDINGS: Uterus Measurements: 7.8 x 4.1 x 5.4 cm. No fibroids or other mass visualized. Endometrium Thickness: 2 mm.  No focal abnormality visualized. Right ovary Measurements: 3.6 x 1.4 x 2.3 cm. Normal appearance/no adnexal mass. Left ovary Measurements: 3.3 x 1.6 x 2.5 cm. Normal appearance/no adnexal mass. Pulsed Doppler evaluation of both ovaries demonstrates normal low-resistance arterial and venous waveforms. Other findings No abnormal free fluid. IMPRESSION: Normal sonographic appearance of the ovaries bilaterally without evidence to suggest torsion. Unremarkable appearance of the  uterus. No significant pelvic free fluid. Electronically Signed   By: Annia Belt M.D.   On: 03/29/2016 14:52    Procedures Procedures (including critical care time)  Medications Ordered in ED Medications  metroNIDAZOLE (FLAGYL) tablet 2,000 mg (2,000 mg Oral Given 03/29/16 1534)  cefTRIAXone (ROCEPHIN) injection 250 mg (250 mg Intramuscular Given 03/29/16 1534)  doxycycline (VIBRA-TABS) tablet 100 mg (100 mg Oral Given 03/29/16 1534)  lidocaine (PF) (XYLOCAINE) 1 % injection (0.9 mLs  Given 03/29/16 1536)     Initial Impression / Assessment and Plan / ED Course  I have reviewed the triage vital signs and the nursing notes.  Pertinent labs & imaging results that were available during my care of the patient were reviewed by me and considered in my medical decision making (see chart for details).  51 year old female with vaginal bleeding consistent with a period. Vitals are normal. She also tested positive for Trichomonas. Labs are unremarkable. On pelvic exam she had adnexal tenderness therefore will treat for PID. Pelvic US was unremarkable. Advised follow up with PCP. Return precautions given.  Final Clinical Impressions(s) / ED Diagnoses   Final diagnoses:  Trichimoniasis  Vaginal bleeding  PID (acute pelvic inflammatory disease)    New Prescriptions New Prescriptions   No medications on file     Bethel Born, PA-C 03/30/16 0618    Melene Plan, DO 03/30/16 1610

## 2016-03-29 NOTE — ED Triage Notes (Signed)
Pt reports menstrual bleeding that started on Tuesday, had lower abd cramping and back pain. Went to restroom and passed large clot, now has moderate vaginal bleeding. Pt states that she thinks she had a miscarriage. No acute distress is noted at this time.

## 2016-03-29 NOTE — Discharge Instructions (Addendum)
Take antibiotic twice daily for 2 weeks Follow up with your family doctor

## 2016-03-29 NOTE — ED Notes (Signed)
Pt given [employer], per [place], Charity fundraiserN.

## 2016-03-29 NOTE — ED Notes (Signed)
Pelvic cart ready at bedside 

## 2016-04-01 LAB — GC/CHLAMYDIA PROBE AMP (~~LOC~~) NOT AT ARMC
Chlamydia: NEGATIVE
Neisseria Gonorrhea: NEGATIVE

## 2017-04-30 ENCOUNTER — Encounter (HOSPITAL_COMMUNITY): Payer: Self-pay | Admitting: *Deleted

## 2017-04-30 ENCOUNTER — Emergency Department (HOSPITAL_COMMUNITY): Payer: Medicaid Other

## 2017-04-30 ENCOUNTER — Other Ambulatory Visit: Payer: Self-pay

## 2017-04-30 ENCOUNTER — Emergency Department (HOSPITAL_COMMUNITY)
Admission: EM | Admit: 2017-04-30 | Discharge: 2017-04-30 | Disposition: A | Discharge status: Home / Self Care | Payer: Medicaid Other | Attending: Emergency Medicine | Admitting: Emergency Medicine

## 2017-04-30 DIAGNOSIS — Y939 Activity, unspecified: Secondary | ICD-10-CM | POA: Diagnosis not present

## 2017-04-30 DIAGNOSIS — Z7982 Long term (current) use of aspirin: Secondary | ICD-10-CM | POA: Diagnosis not present

## 2017-04-30 DIAGNOSIS — Z79899 Other long term (current) drug therapy: Secondary | ICD-10-CM | POA: Insufficient documentation

## 2017-04-30 DIAGNOSIS — M7022 Olecranon bursitis, left elbow: Secondary | ICD-10-CM

## 2017-04-30 DIAGNOSIS — M25522 Pain in left elbow: Secondary | ICD-10-CM | POA: Diagnosis present

## 2017-04-30 DIAGNOSIS — F17210 Nicotine dependence, cigarettes, uncomplicated: Secondary | ICD-10-CM | POA: Insufficient documentation

## 2017-04-30 DIAGNOSIS — E119 Type 2 diabetes mellitus without complications: Secondary | ICD-10-CM | POA: Diagnosis not present

## 2017-04-30 DIAGNOSIS — Z7984 Long term (current) use of oral hypoglycemic drugs: Secondary | ICD-10-CM | POA: Diagnosis not present

## 2017-04-30 MED ORDER — IBUPROFEN 800 MG PO TABS
800.0000 mg | ORAL_TABLET | Freq: Once | ORAL | Status: AC
  Administered 2017-04-30: 800 mg via ORAL
  Filled 2017-04-30: qty 1

## 2017-04-30 MED ORDER — IBUPROFEN 600 MG PO TABS: 600.0000 mg | ORAL_TABLET | Freq: Four times a day (QID) | ORAL | 0 refills | Status: DC | PRN

## 2017-04-30 MED ORDER — ACETAMINOPHEN ER 650 MG PO TBCR: 650.0000 mg | EXTENDED_RELEASE_TABLET | Freq: Three times a day (TID) | ORAL | 0 refills | Status: DC | PRN

## 2017-04-30 NOTE — ED Triage Notes (Signed)
Pt states R elbow pain and swelling x 2.5 weeks.

## 2017-04-30 NOTE — Progress Notes (Signed)
Orthopedic Tech Progress Note Patient Details:  Angel Mendez Aug 08, 1965 [id number]  Ortho Devices Type of Ortho Device: Arm sling Ortho Device/Splint Location: lue Ortho Device/Splint Interventions: Ordered, Application, Adjustment   Post Interventions Patient Tolerated: Well Instructions Provided: Care of device, Adjustment of device   Angel Mendez, Osei Anger J 04/30/2017, 11:14 PM

## 2017-04-30 NOTE — Discharge Instructions (Signed)
Thank you for allowing me to provide your care today in the emergency department.  Call [facility] to schedule a follow-up appointment if your symptoms do not start to improve within the next 5 to 7 days.  Take 600 mg of ibuprofen with food or 650 mg of Tylenol every 6 hours for pain control.  You can also alternate between these 2 medications and take 1 dose of each every 3 hours.  Apply ice for 15 to 20 minutes up to 3-4 times per day as directed on the following discharge paperwork to help with pain and swelling.  Wear the sling as needed for comfort.  You can purchase an elbow pad, which is available over-the-counter, which may help to improve your pain.  If you develop new or worsening symptoms including if the left elbow becomes hot to the touch and red, if you develop a high fever, or if you have a fall or injury, please return to the emergency department for re-evaluation.

## 2017-04-30 NOTE — ED Notes (Signed)
Patient transported to X-ray 

## 2017-04-30 NOTE — ED Provider Notes (Signed)
[facility] EMERGENCY DEPARTMENT Provider Note   CSN: [id number] Arrival date & time: 04/30/17  1604     History   Chief Complaint Chief Complaint  Patient presents with  . Elbow Pain    HPI Angel Mendez is a 52 y.o. female  Who presents to the emergency department with a chief complaint of atraumatic left elbow pain and swelling that began 2.5 weeks ago.  Pain is worse with picking up objects.  She reports that she has been able to lift her 12-month-old granddaughter over the last few days.  She states that when she tries to move her elbow that it feels as if someone is hitting her with a hammer.  No known trauma or injury.  No history of left elbow surgery or injury.  She denies redness, warmth, fever, chills, weakness, numbness.  She has no history of gout or joint injections.  She treated her symptoms with a topical muscle cream with no improvement.  The history is provided by the patient. No language interpreter was used.    Past Medical History:  Diagnosis Date  . Depression    currently on meds  . Diabetes mellitus    NIDDM  . GERD (gastroesophageal reflux disease)    treats w/OTC meds  . Meniscus tear 04/2011   right knee  . Pneumonia   . Seasonal allergies   . Wears glasses     Patient Active Problem List   Diagnosis Date Noted  . Bimalleolar ankle fracture 10/01/2013  . Substance abuse (HCC) 01/12/2012  . Suicide attempt (HCC) 01/08/2012  . Depression 01/08/2012  . Diabetes mellitus, type II (HCC) 01/08/2012    Past Surgical History:  Procedure Laterality Date  . ANKLE SURGERY    . ESOPHAGOGASTRODUODENOSCOPY  01/09/2012   Procedure: ESOPHAGOGASTRODUODENOSCOPY (EGD);  Surgeon: Barrie Folk, MD;  Location: [facility];  Service: Endoscopy;  Laterality: N/A;  . KNEE ARTHROSCOPY  04/17/2011   Procedure: ARTHROSCOPY KNEE;  Surgeon: Sherri Rad, MD;  Location: [facility];  Service: Orthopedics;  Laterality: Right;   Removal of Plica and Medial Menical Debridement  . NO PAST SURGERIES    . ORIF ANKLE FRACTURE Left 10/01/2013   Procedure: OPEN REDUCTION INTERNAL FIXATION (ORIF) LEFT ANKLE FRACTURE;  Surgeon: Cheral Almas, MD;  Location: [facility];  Service: Orthopedics;  Laterality: Left;     OB History   None      Home Medications    Prior to Admission medications   Medication Sig Start Date End Date Taking? Authorizing Provider  acetaminophen (TYLENOL 8 HOUR) 650 MG CR tablet Take 1 tablet (650 mg total) by mouth every 8 (eight) hours as needed for pain. 04/30/17   Nyiesha Beever A, PA-C  albuterol (PROVENTIL HFA;VENTOLIN HFA) 108 (90 Base) MCG/ACT inhaler Inhale 1-2 puffs into the lungs every 6 (six) hours as needed for wheezing or shortness of breath. 09/18/15   Sam, Ace Gins, PA-C  ARIPiprazole (ABILIFY) 10 MG tablet Take 10 mg by mouth daily.    [provider]  aspirin EC 325 MG tablet Take 1 tablet (325 mg total) by mouth 2 (two) times daily. Patient not taking: Reported on 06/24/2014 10/01/13   Tarry Kos, MD  azithromycin (ZITHROMAX Z-PAK) 250 MG tablet 2 po day one, then 1 daily x 4 days 02/01/15   Fayrene Helper, PA-C  cetirizine-pseudoephedrine (ZYRTEC-D) 5-120 MG tablet Take 1 tablet by mouth daily as needed (runny/stuffy nose). 09/18/15   Noelle Penner  Y, PA-C  doxycycline (VIBRAMYCIN) 100 MG capsule Take 1 capsule (100 mg total) by mouth 2 (two) times daily. 03/29/16   Bethel Born, PA-C  guaiFENesin (ROBITUSSIN) 100 MG/5ML liquid Take 5-10 mLs (100-200 mg total) by mouth every 4 (four) hours as needed for congestion. 02/01/15   Fayrene Helper, PA-C  ibuprofen (ADVIL,MOTRIN) 600 MG tablet Take 1 tablet (600 mg total) by mouth every 6 (six) hours as needed. 04/30/17   Chyanna Flock A, PA-C  metFORMIN (GLUCOPHAGE) 500 MG tablet Take 1 tablet (500 mg total) by mouth daily with breakfast. 03/29/16   Bethel Born, PA-C  omeprazole (PRILOSEC) 20 MG capsule Take 1 capsule (20 mg total)  by mouth daily. 03/29/16   Bethel Born, PA-C  pantoprazole (PROTONIX) 40 MG tablet Take 40 mg by mouth daily.    [provider]  promethazine-dextromethorphan (PROMETHAZINE-DM) 6.25-15 MG/5ML syrup Take 5 mLs by mouth 4 (four) times daily as needed for cough. 02/01/15   Fayrene Helper, PA-C  promethazine-dextromethorphan (PROMETHAZINE-DM) 6.25-15 MG/5ML syrup Take 5 mLs by mouth 4 (four) times daily as needed for cough. 09/18/15   Sam, Ace Gins, PA-C    Family History Family History  Problem Relation Age of Onset  . Cancer - Other Mother   . Seizures Father   . Cancer - Lung Other     Social History Social History   Tobacco Use  . Smoking status: Current Every Day Smoker    Packs/day: 0.50    Years: 15.00    Pack years: 7.50    Types: Cigarettes  . Smokeless tobacco: Never Used  . Tobacco comment: 5 cig./day  Substance Use Topics  . Alcohol use: Yes    Comment: " social beer"  . Drug use: No    Comment: " used crack-cocaine in 2002 "     Allergies   Patient has no known allergies.   Review of Systems Review of Systems  Constitutional: Negative for activity change.  Respiratory: Negative for shortness of breath.   Cardiovascular: Negative for chest pain.  Gastrointestinal: Negative for abdominal pain.  Musculoskeletal: Positive for arthralgias, joint swelling and myalgias. Negative for back pain and gait problem.  Skin: Negative for rash.  Allergic/Immunologic: Positive for immunocompromised state.  Neurological: Negative for weakness and numbness.     Physical Exam Updated Vital Signs BP (!) 137/91   Pulse 75   Temp 97.9 F (36.6 C) (Oral)   Resp 16   Ht 5\' 8"  (1.727 m)   Wt 99.8 kg (220 lb)   LMP 04/28/2017   SpO2 96%   BMI 33.45 kg/m   Physical Exam  Constitutional: No distress.  HENT:  Head: Normocephalic.  Eyes: Conjunctivae are normal.  Neck: Neck supple.  Cardiovascular: Normal rate and regular rhythm. Exam reveals no gallop and  no friction rub.  No murmur heard. Pulmonary/Chest: Effort normal. No respiratory distress.  Abdominal: Soft. She exhibits no distension.  Musculoskeletal: She exhibits edema and tenderness. She exhibits no deformity.  Diffusely tender to palpation over the left elbow.  No tenderness over the left wrist or shoulder.  Full active and passive range of motion of the left wrist and shoulder.  Sensation is intact throughout the left upper extremity.  Radial pulses are 2+ and symmetric.  Decreased range of motion of the left elbow secondary to pain.  Pain is increased with pronation, supination, flexion, and extension.  There is edema noted to the posterior aspect of the elbow.  There is no  warmth or redness.  Neurological: She is alert.  Skin: Skin is warm. No rash noted.  Psychiatric: Her behavior is normal.  Nursing note and vitals reviewed.  ED Treatments / Results  Labs (all labs ordered are listed, but only abnormal results are displayed) Labs Reviewed - No data to display  EKG None  Radiology Dg Elbow Complete Left  Result Date: 04/30/2017 CLINICAL DATA:  Elbow pain for 2 weeks.  No known injury. EXAM: LEFT ELBOW - COMPLETE 3+ VIEW COMPARISON:  None. FINDINGS: There is a left elbow joint effusion. No visible fracture. No subluxation or dislocation. IMPRESSION: Left elbow joint effusion. No visible fracture. If there is clinical history of trauma, consider immobilization and repeat imaging in 1 week if symptoms persist. Electronically Signed   By: Charlett NoseKevin  Dover M.D.   On: 04/30/2017 19:02    Procedures Procedures (including critical care time)  Medications Ordered in ED Medications  ibuprofen (ADVIL,MOTRIN) tablet 800 mg (800 mg Oral Given 04/30/17 2116)     Initial Impression / Assessment and Plan / ED Course  I have reviewed the triage vital signs and the nursing notes.  Pertinent labs & imaging results that were available during my care of the patient were reviewed by me and  considered in my medical decision making (see chart for details).     Patient X-Ray negative for obvious fracture or dislocation.  Large left elbow joint effusion is noted.  On exam, she has edema to the posterior elbow.  No focal pain to the olecranon, distal humerus, or proximal radius and ulna.  Suspect olecranon bursitis pain managed in ED. Pt advised to follow up with orthopedics if symptoms persist for possibility of missed fracture diagnosis.  Doubt septic joint or gout. Patient given brace while in ED, conservative therapy recommended and discussed.  She is established with orthopedics and will call to schedule follow-up appointment if symptoms do not improve. Patient will be dc home & is agreeable with above plan.  Final Clinical Impressions(s) / ED Diagnoses   Final diagnoses:  Olecranon bursitis of left elbow    ED Discharge Orders        Ordered    ibuprofen (ADVIL,MOTRIN) 600 MG tablet  Every 6 hours PRN     04/30/17 2114    acetaminophen (TYLENOL 8 HOUR) 650 MG CR tablet  Every 8 hours PRN     04/30/17 2114       Frederik PearMcDonald, Polo Mcmartin A, PA-C 04/30/17 2120    Mesner, Barbara CowerJason, MD 04/30/17 2231

## 2017-04-30 NOTE — ED Notes (Signed)
Pt ambulated to bathroom , No signs of acute distress

## 2017-05-01 LAB — CBG MONITORING, ED: Glucose-Capillary: 141 mg/dL — ABNORMAL HIGH (ref 65–99)

## 2017-12-08 ENCOUNTER — Emergency Department (HOSPITAL_COMMUNITY): Payer: Medicaid Other

## 2017-12-08 ENCOUNTER — Encounter (HOSPITAL_COMMUNITY): Payer: Self-pay

## 2017-12-08 ENCOUNTER — Emergency Department (HOSPITAL_COMMUNITY)
Admission: EM | Admit: 2017-12-08 | Discharge: 2017-12-08 | Disposition: A | Discharge status: Home / Self Care | Payer: Medicaid Other | Attending: Emergency Medicine | Admitting: Emergency Medicine

## 2017-12-08 DIAGNOSIS — Y929 Unspecified place or not applicable: Secondary | ICD-10-CM | POA: Insufficient documentation

## 2017-12-08 DIAGNOSIS — S93602A Unspecified sprain of left foot, initial encounter: Secondary | ICD-10-CM | POA: Insufficient documentation

## 2017-12-08 DIAGNOSIS — E119 Type 2 diabetes mellitus without complications: Secondary | ICD-10-CM | POA: Diagnosis not present

## 2017-12-08 DIAGNOSIS — Z7984 Long term (current) use of oral hypoglycemic drugs: Secondary | ICD-10-CM | POA: Insufficient documentation

## 2017-12-08 DIAGNOSIS — Y939 Activity, unspecified: Secondary | ICD-10-CM | POA: Diagnosis not present

## 2017-12-08 DIAGNOSIS — F17210 Nicotine dependence, cigarettes, uncomplicated: Secondary | ICD-10-CM | POA: Insufficient documentation

## 2017-12-08 DIAGNOSIS — S99922A Unspecified injury of left foot, initial encounter: Secondary | ICD-10-CM | POA: Diagnosis present

## 2017-12-08 DIAGNOSIS — W010XXA Fall on same level from slipping, tripping and stumbling without subsequent striking against object, initial encounter: Secondary | ICD-10-CM | POA: Insufficient documentation

## 2017-12-08 DIAGNOSIS — Y998 Other external cause status: Secondary | ICD-10-CM | POA: Diagnosis not present

## 2017-12-08 DIAGNOSIS — Z79899 Other long term (current) drug therapy: Secondary | ICD-10-CM | POA: Diagnosis not present

## 2017-12-08 NOTE — ED Provider Notes (Signed)
[facility] EMERGENCY DEPARTMENT Provider Note   CSN: [id number] Arrival date & time: 12/08/17  1031     History   Chief Complaint Chief Complaint  Patient presents with  . Foot Pain    HPI Angel Mendez is a 52 y.o. female.  The history is provided by the patient.  Foot Pain  This is a new problem. Episode onset: 2 weeks ago. The problem occurs constantly. The problem has not changed since onset.Associated symptoms comments: Left foot pain and swelling in the mid foot since tripping and falling forward and twisting the foot 2 weeks ago.  No upper leg or knee pain.  Prior history of surgery of the left ankle but no pain in the ankle.. The symptoms are aggravated by walking. The symptoms are relieved by ice (Elevation). She has tried rest for the symptoms. The treatment provided no relief.    Past Medical History:  Diagnosis Date  . Depression    currently on meds  . Diabetes mellitus    NIDDM  . GERD (gastroesophageal reflux disease)    treats w/OTC meds  . Meniscus tear 04/2011   right knee  . Pneumonia   . Seasonal allergies   . Wears glasses     Patient Active Problem List   Diagnosis Date Noted  . Bimalleolar ankle fracture 10/01/2013  . Substance abuse (HCC) 01/12/2012  . Suicide attempt (HCC) 01/08/2012  . Depression 01/08/2012  . Diabetes mellitus, type II (HCC) 01/08/2012    Past Surgical History:  Procedure Laterality Date  . ANKLE SURGERY    . ESOPHAGOGASTRODUODENOSCOPY  01/09/2012   Procedure: ESOPHAGOGASTRODUODENOSCOPY (EGD);  Surgeon: Barrie Folk, MD;  Location: [facility];  Service: Endoscopy;  Laterality: N/A;  . KNEE ARTHROSCOPY  04/17/2011   Procedure: ARTHROSCOPY KNEE;  Surgeon: Sherri Rad, MD;  Location: [facility];  Service: Orthopedics;  Laterality: Right;  Removal of Plica and Medial Menical Debridement  . NO PAST SURGERIES    . ORIF ANKLE FRACTURE Left 10/01/2013   Procedure: OPEN  REDUCTION INTERNAL FIXATION (ORIF) LEFT ANKLE FRACTURE;  Surgeon: Cheral Almas, MD;  Location: [facility];  Service: Orthopedics;  Laterality: Left;     OB History   None      Home Medications    Prior to Admission medications   Medication Sig Start Date End Date Taking? Authorizing Provider  acetaminophen (TYLENOL 8 HOUR) 650 MG CR tablet Take 1 tablet (650 mg total) by mouth every 8 (eight) hours as needed for pain. 04/30/17   McDonald, Mia A, PA-C  albuterol (PROVENTIL HFA;VENTOLIN HFA) 108 (90 Base) MCG/ACT inhaler Inhale 1-2 puffs into the lungs every 6 (six) hours as needed for wheezing or shortness of breath. 09/18/15   Sam, Ace Gins, PA-C  ARIPiprazole (ABILIFY) 10 MG tablet Take 10 mg by mouth daily.    [provider]  aspirin EC 325 MG tablet Take 1 tablet (325 mg total) by mouth 2 (two) times daily. Patient not taking: Reported on 06/24/2014 10/01/13   Tarry Kos, MD  azithromycin (ZITHROMAX Z-PAK) 250 MG tablet 2 po day one, then 1 daily x 4 days 02/01/15   Fayrene Helper, PA-C  cetirizine-pseudoephedrine (ZYRTEC-D) 5-120 MG tablet Take 1 tablet by mouth daily as needed (runny/stuffy nose). 09/18/15   Sam, Ace Gins, PA-C  doxycycline (VIBRAMYCIN) 100 MG capsule Take 1 capsule (100 mg total) by mouth 2 (two) times daily. 03/29/16   Bethel Born, PA-C  guaiFENesin (  ROBITUSSIN) 100 MG/5ML liquid Take 5-10 mLs (100-200 mg total) by mouth every 4 (four) hours as needed for congestion. 02/01/15   Fayrene Helper, PA-C  ibuprofen (ADVIL,MOTRIN) 600 MG tablet Take 1 tablet (600 mg total) by mouth every 6 (six) hours as needed. 04/30/17   McDonald, Mia A, PA-C  metFORMIN (GLUCOPHAGE) 500 MG tablet Take 1 tablet (500 mg total) by mouth daily with breakfast. 03/29/16   Bethel Born, PA-C  omeprazole (PRILOSEC) 20 MG capsule Take 1 capsule (20 mg total) by mouth daily. 03/29/16   Bethel Born, PA-C  pantoprazole (PROTONIX) 40 MG tablet Take 40 mg by mouth daily.    [provider]  promethazine-dextromethorphan (PROMETHAZINE-DM) 6.25-15 MG/5ML syrup Take 5 mLs by mouth 4 (four) times daily as needed for cough. 02/01/15   Fayrene Helper, PA-C  promethazine-dextromethorphan (PROMETHAZINE-DM) 6.25-15 MG/5ML syrup Take 5 mLs by mouth 4 (four) times daily as needed for cough. 09/18/15   Sam, Ace Gins, PA-C    Family History Family History  Problem Relation Age of Onset  . Cancer - Other Mother   . Seizures Father   . Cancer - Lung Other     Social History Social History   Tobacco Use  . Smoking status: Current Every Day Smoker    Packs/day: 0.50    Years: 15.00    Pack years: 7.50    Types: Cigarettes  . Smokeless tobacco: Never Used  . Tobacco comment: 5 cig./day  Substance Use Topics  . Alcohol use: Yes    Comment: " social beer"  . Drug use: No    Comment: " used crack-cocaine in 2002 "     Allergies   Patient has no known allergies.   Review of Systems Review of Systems  All other systems reviewed and are negative.    Physical Exam Updated Vital Signs BP (!) 141/92 (BP Location: Right Arm)   Pulse 85   Temp 98 F (36.7 C) (Oral)   Resp 16   SpO2 93%   Physical Exam  Constitutional: She is oriented to person, place, and time. She appears well-developed and well-nourished. No distress.  HENT:  Head: Normocephalic and atraumatic.  Eyes: Pupils are equal, round, and reactive to light.  Cardiovascular: Normal rate and intact distal pulses.  Pulmonary/Chest: Effort normal. No respiratory distress.  Musculoskeletal: Normal range of motion. She exhibits tenderness. She exhibits no edema.       Feet:  Neurological: She is alert and oriented to person, place, and time.  Skin: Skin is warm and dry. Capillary refill takes less than 2 seconds. No rash noted. No erythema.  Psychiatric: She has a normal mood and affect. Her behavior is normal.  Nursing note and vitals reviewed.    ED Treatments / Results  Labs (all labs ordered  are listed, but only abnormal results are displayed) Labs Reviewed - No data to display  EKG None  Radiology Dg Foot Complete Left  Result Date: 12/08/2017 CLINICAL DATA:  Left foot pain and swelling since the patient tripped 10 days ago. Initial encounter. EXAM: LEFT FOOT - COMPLETE 3+ VIEW COMPARISON:  None. FINDINGS: No acute bony or joint abnormality is identified. Fixation of remote healed medial and lateral malleolar fractures noted. No evidence of arthropathy. Soft tissues are unremarkable. IMPRESSION: No acute abnormality. Remote healed medial and lateral malleolar fractures with fixation hardware in place. Electronically Signed   By: Drusilla Kanner M.D.   On: 12/08/2017 11:17    Procedures Procedures (  including critical care time)  Medications Ordered in ED Medications - No data to display   Initial Impression / Assessment and Plan / ED Course  I have reviewed the triage vital signs and the nursing notes.  Pertinent labs & imaging results that were available during my care of the patient were reviewed by me and considered in my medical decision making (see chart for details).     Patient presenting today with symptoms of most likely foot sprain after falling 2 weeks ago.  Midfoot is tender and swollen on exam.  X-ray is negative for acute fractures.  Patient's prior fracture and surgical repair appears well-healed and in good position.  Findings discussed with the patient and she was put in a cam walker.  She will follow-up with orthopedics if symptoms do not improve.  Final Clinical Impressions(s) / ED Diagnoses   Final diagnoses:  Sprain of left foot, initial encounter    ED Discharge Orders    None       Gwyneth SproutPlunkett, Milanni Ayub, MD 12/08/17 1149

## 2017-12-08 NOTE — ED Notes (Signed)
Patient transported to X-ray 

## 2017-12-08 NOTE — ED Notes (Signed)
Pt. returned from XR. 

## 2017-12-08 NOTE — ED Triage Notes (Signed)
Pt presents for evaluation of left foot pain. States injured foot by tripping a few weeks ago.

## 2018-04-22 ENCOUNTER — Emergency Department (HOSPITAL_COMMUNITY)
Admission: EM | Admit: 2018-04-22 | Discharge: 2018-04-22 | Discharge status: Against Medical Advice | Payer: Medicaid Other | Attending: Emergency Medicine | Admitting: Emergency Medicine

## 2018-04-22 DIAGNOSIS — Z5321 Procedure and treatment not carried out due to patient leaving prior to being seen by health care provider: Secondary | ICD-10-CM | POA: Insufficient documentation

## 2018-04-22 DIAGNOSIS — R112 Nausea with vomiting, unspecified: Secondary | ICD-10-CM | POA: Diagnosis present

## 2018-04-22 LAB — COMPREHENSIVE METABOLIC PANEL
ALT: 50 U/L — ABNORMAL HIGH (ref 0–44)
AST: 71 U/L — ABNORMAL HIGH (ref 15–41)
Albumin: 3.8 g/dL (ref 3.5–5.0)
Alkaline Phosphatase: 75 U/L (ref 38–126)
Anion gap: 11 (ref 5–15)
BUN: 8 mg/dL (ref 6–20)
CO2: 24 mmol/L (ref 22–32)
Calcium: 9.1 mg/dL (ref 8.9–10.3)
Chloride: 105 mmol/L (ref 98–111)
Creatinine, Ser: 0.89 mg/dL (ref 0.44–1.00)
GFR calc Af Amer: >60 mL/min (ref >60)
GFR calc non Af Amer: >60 mL/min (ref >60)
Glucose, Bld: 143 mg/dL — ABNORMAL HIGH (ref 70–99)
Potassium: 3.8 mmol/L (ref 3.5–5.1)
Sodium: 140 mmol/L (ref 135–145)
Total Bilirubin: 0.4 mg/dL (ref 0.3–1.2)
Total Protein: 8.2 g/dL — ABNORMAL HIGH (ref 6.5–8.1)

## 2018-04-22 LAB — CBC
HCT: 45.4 % (ref 36.0–46.0)
Hemoglobin: 14.4 g/dL (ref 12.0–15.0)
MCH: 29 pg (ref 26.0–34.0)
MCHC: 31.7 g/dL (ref 30.0–36.0)
MCV: 91.3 fL (ref 80.0–100.0)
Platelets: 220 10*3/uL (ref 150–400)
RBC: 4.97 MIL/uL (ref 3.87–5.11)
RDW: 14.3 % (ref 11.5–15.5)
WBC: 11.3 10*3/uL — ABNORMAL HIGH (ref 4.0–10.5)
nRBC: 0 % (ref 0.0–0.2)

## 2018-04-22 LAB — LIPASE, BLOOD: Lipase: 45 U/L (ref 11–51)

## 2018-04-22 LAB — ETHANOL: Alcohol, Ethyl (B): 280 mg/dL — ABNORMAL HIGH (ref <10)

## 2018-04-22 MED ORDER — SODIUM CHLORIDE 0.9% FLUSH: 3.0000 mL | Freq: Once | INTRAVENOUS | Status: DC

## 2018-04-22 MED ORDER — ONDANSETRON HCL 4 MG/2ML IJ SOLN
4.0000 mg | Freq: Once | INTRAMUSCULAR | Status: AC | PRN
  Administered 2018-04-22: 4 mg via INTRAVENOUS
  Filled 2018-04-22: qty 2

## 2018-04-22 NOTE — ED Triage Notes (Addendum)
Patient states she was drinking "a whole" today and on the way home starting throwing up with "spots of blood". Per EMS no blood noted in emesis bag on scene. Patient also c/o acid reflux. Denies any other symptoms at this time. Patient A&O x 4.   BP 116/70 100 97% RA  CBG 135

## 2018-04-22 NOTE — ED Notes (Signed)
Pt started to walk outside with an IV in her arm, this rn tried to stop pt to remove iv but pt refused. This RN and security followed pt up to the sidewalk, pt removed IV herself.

## 2018-04-22 NOTE — ED Notes (Signed)
Have patient call Angel Mendez [phone] as soon as she is able to update him

## 2018-04-23 LAB — I-STAT BETA HCG BLOOD, ED (MC, WL, AP ONLY): I-stat hCG, quantitative: <5 m[IU]/mL (ref <5)

## 2018-11-30 ENCOUNTER — Emergency Department (HOSPITAL_COMMUNITY)
Admission: EM | Admit: 2018-11-30 | Discharge: 2018-11-30 | Disposition: A | Discharge status: Home / Self Care | Payer: Medicaid Other | Attending: Emergency Medicine | Admitting: Emergency Medicine

## 2018-11-30 ENCOUNTER — Other Ambulatory Visit: Payer: Self-pay

## 2018-11-30 ENCOUNTER — Encounter (HOSPITAL_COMMUNITY): Payer: Self-pay | Admitting: Emergency Medicine

## 2018-11-30 ENCOUNTER — Emergency Department (HOSPITAL_COMMUNITY): Payer: Medicaid Other

## 2018-11-30 DIAGNOSIS — F17210 Nicotine dependence, cigarettes, uncomplicated: Secondary | ICD-10-CM | POA: Diagnosis not present

## 2018-11-30 DIAGNOSIS — M25512 Pain in left shoulder: Secondary | ICD-10-CM

## 2018-11-30 DIAGNOSIS — E119 Type 2 diabetes mellitus without complications: Secondary | ICD-10-CM | POA: Insufficient documentation

## 2018-11-30 MED ORDER — KETOROLAC TROMETHAMINE 60 MG/2ML IM SOLN
60.0000 mg | Freq: Once | INTRAMUSCULAR | Status: AC
  Administered 2018-11-30: 20:00:00 60 mg via INTRAMUSCULAR
  Filled 2018-11-30: qty 2

## 2018-11-30 MED ORDER — MELOXICAM 7.5 MG PO TABS: 7.5000 mg | ORAL_TABLET | Freq: Every day | ORAL | 0 refills | Status: DC

## 2018-11-30 MED ORDER — LIDO-CAPSAICIN-MEN-METHYL SAL 0.5-0.035-5-20 % EX PTCH: 1.0000 | MEDICATED_PATCH | Freq: Every day | CUTANEOUS | 0 refills | Status: DC

## 2018-11-30 NOTE — ED Triage Notes (Signed)
Pt c/o left shoulder pain x 2 weeks. Denies injury/fall, hx tendonitis in shoulder.

## 2018-11-30 NOTE — ED Notes (Signed)
Patient verbalizes understanding of discharge instructions. Opportunity for questioning and answers were provided. Armband removed by staff, pt discharged from ED. Pt left before able to provide paper copy of discharge instructions and paperwork.

## 2018-11-30 NOTE — ED Provider Notes (Signed)
Emergency Department Provider Note   I have reviewed the triage vital signs and the nursing notes.   HISTORY  Chief Complaint Shoulder Pain   HPI Angel Mendez is a 53 y.o. female who presents to the emergency department today with left shoulder pain for the last 2 weeks.  Patient states is been progressively worsening.  Feels like when she had tendinitis in the past.  She has not anything for symptoms at home.  States is worse with range of motion and better with rest.  No chest pain, back pain, fevers, cough, nausea or vomiting.   No other associated or modifying symptoms.    Past Medical History:  Diagnosis Date  . Depression    currently on meds  . Diabetes mellitus    NIDDM  . GERD (gastroesophageal reflux disease)    treats w/OTC meds  . Meniscus tear 04/2011   right knee  . Pneumonia   . Seasonal allergies   . Wears glasses     Patient Active Problem List   Diagnosis Date Noted  . Bimalleolar ankle fracture 10/01/2013  . Substance abuse (HCC) 01/12/2012  . Suicide attempt (HCC) 01/08/2012  . Depression 01/08/2012  . Diabetes mellitus, type II (HCC) 01/08/2012    Past Surgical History:  Procedure Laterality Date  . ANKLE SURGERY    . ESOPHAGOGASTRODUODENOSCOPY  01/09/2012   Procedure: ESOPHAGOGASTRODUODENOSCOPY (EGD);  Surgeon: Barrie FolkJohn C Hayes, MD;  [facility];  Service: Endoscopy;  Laterality: N/A;  . KNEE ARTHROSCOPY  04/17/2011   Procedure: ARTHROSCOPY KNEE;  Surgeon: Sherri RadPaul A Bednarz, MD;  Location: [facility];  Service: Orthopedics;  Laterality: Right;  Removal of Plica and Medial Menical Debridement  . NO PAST SURGERIES    . ORIF ANKLE FRACTURE Left 10/01/2013   Procedure: OPEN REDUCTION INTERNAL FIXATION (ORIF) LEFT ANKLE FRACTURE;  Surgeon: Cheral AlmasNaiping Michael Xu, MD;  Location: [facility];  Service: Orthopedics;  Laterality: Left;    Current Outpatient Rx  . Order #: [id number] Class: Print  . Order #: [id number] Class:  Print  . Order #: [id number] Class: Historical Med  . Order #: [id number] Class: Print  . Order #: [id number] Class: Print  . Order #: [id number] Class: Print  . Order #: [id number] Class: Print  . Order #: [id number] Class: Print  . Order #: [id number] Class: Print  . Order #: [id number] Class: Print  . Order #: [id number] Class: Print  . Order #: [id number] Class: Print  . Order #: [id number] Class: Print  . Order #: [id number] Class: Historical Med  . Order #: [id number] Class: Print  . Order #: [id number] Class: Print    Allergies Patient has no known allergies.  Family History  Problem Relation Age of Onset  . Cancer - Other Mother   . Seizures Father   . Cancer - Lung Other     Social History Social History   Tobacco Use  . Smoking status: Current Every Day Smoker    Packs/day: 0.50    Years: 15.00    Pack years: 7.50    Types: Cigarettes  . Smokeless tobacco: Never Used  . Tobacco comment: 5 cig./day  Substance Use Topics  . Alcohol use: Yes    Comment: " social beer"  . Drug use: No    Comment: " used crack-cocaine in 2002 "    Review of Systems  All other systems negative except as documented in the HPI. All pertinent positives and negatives as reviewed in the HPI. ____________________________________________   PHYSICAL EXAM:  VITAL SIGNS: ED Triage Vitals  Enc Vitals Group  BP 11/30/18 1817 133/82     Pulse Rate 11/30/18 1817 93     Resp 11/30/18 1817 16     Temp 11/30/18 1817 98.3 F (36.8 C)     Temp Source 11/30/18 1817 Oral     SpO2 11/30/18 1817 96 %     Weight 11/30/18 1814 226 lb (102.5 kg)     Height 11/30/18 1814 5\' 7"  (1.702 m)     Head Circumference --      Peak Flow --      Pain Score 11/30/18 1813 9     Pain Loc --      Pain Edu? --      Excl. in [place]? --     Constitutional: Alert and oriented. Well appearing and in no acute distress. Eyes: Conjunctivae are normal. PERRL. EOMI. Head: Atraumatic. Nose: No congestion/rhinnorhea. Mouth/Throat:  Mucous membranes are moist.  Oropharynx non-erythematous. Neck: No stridor.  No meningeal signs.   Cardiovascular: Normal rate, regular rhythm. Good peripheral circulation. Grossly normal heart sounds.   Respiratory: Normal respiratory effort.  No retractions. Lungs CTAB. Gastrointestinal: Soft and nontender. No distention.  Musculoskeletal: Pain with range of motion of left shoulder.  No warmth.  Some tenderness to palpation over the biceps tendon origin. Normal strength and pulses. Neurologic:  Normal speech and language. No gross focal neurologic deficits are appreciated.  Skin:  Skin is warm, dry and intact. No rash noted.   ____________________________________________   MPNTIRWER  Dg Shoulder Left  Result Date: 11/30/2018 CLINICAL DATA:  Shoulder pain for 2 weeks without trauma. EXAM: LEFT SHOULDER - 2+ VIEW COMPARISON:  None. FINDINGS: No acute fracture or dislocation. Visualized portion of the left hemithorax is normal. IMPRESSION: No acute osseous abnormality. Electronically Signed   By: Abigail Miyamoto M.D.   On: 11/30/2018 18:57    ____________________________________________   PROCEDURES  Procedure(s) performed:   Procedures   ____________________________________________   INITIAL IMPRESSION / ASSESSMENT AND PLAN / ED COURSE  Muscular pain of her left shoulder.  We will put in a sling with sports medicine follow-up for likely physical therapy.  Anti-inflammatories in the meantime.     Pertinent labs & imaging results that were available during my care of the patient were reviewed by me and considered in my medical decision making (see chart for details).  A medical screening exam was performed and I feel the patient has had an appropriate workup for their chief complaint at this time and likelihood of emergent condition existing is low. They have been counseled on decision, discharge, follow up and which symptoms necessitate immediate return to the emergency  department. They or their family verbally stated understanding and agreement with plan and discharged in stable condition.   ____________________________________________  FINAL CLINICAL IMPRESSION(S) / ED DIAGNOSES  Final diagnoses:  Acute pain of left shoulder     MEDICATIONS GIVEN DURING THIS VISIT:  Medications  ketorolac (TORADOL) injection 60 mg (60 mg Intramuscular Given 11/30/18 2010)     NEW OUTPATIENT MEDICATIONS STARTED DURING THIS VISIT:  Discharge Medication List as of 11/30/2018  7:59 PM    START taking these medications   Details  Lido-Capsaicin-Men-Methyl Sal 0.5-0.035-5-20 % PTCH Apply 1 patch topically daily., Starting Mon 11/30/2018, Print    meloxicam (MOBIC) 7.5 MG tablet Take 1 tablet (7.5 mg total) by mouth daily., Starting Mon 11/30/2018, Print        Note:  This note was prepared with assistance of Dragon voice recognition software. Occasional wrong-word or sound-a-like  substitutions may have occurred due to the inherent limitations of voice recognition software.   Marily Memos, MD 11/30/18 [phone]

## 2018-11-30 NOTE — Progress Notes (Signed)
Orthopedic Tech Progress Note Patient Details:  Angel Mendez October 10, 1965 [id number]  Ortho Devices Type of Ortho Device: Sling arm elevator Ortho Device/Splint Location: LUE Ortho Device/Splint Interventions: Ordered, Application, Adjustment   Post Interventions Patient Tolerated: Well Instructions Provided: Adjustment of device, Care of device   Alastair Hennes N [name] 11/30/2018, 8:20 PM

## 2018-11-30 NOTE — ED Notes (Signed)
Please call Ayanna, pt's daughter when pt in room [phone]

## 2019-05-03 ENCOUNTER — Ambulatory Visit: Payer: Medicaid Other | Attending: Internal Medicine

## 2019-05-03 DIAGNOSIS — Z23 Encounter for immunization: Secondary | ICD-10-CM

## 2019-05-03 NOTE — Progress Notes (Signed)
   Covid-19 Vaccination Clinic  Name:  Angel Mendez    MRN: [id number] DOB: July 24, 1965  05/03/2019  [name] was observed post Covid-19 immunization for 15 minutes without incident. She was provided with Vaccine Information Sheet and instruction to access the V-Safe system.   [name] was instructed to call 911 with any severe reactions post vaccine: Marland Kitchen Difficulty breathing  . Swelling of face and throat  . A fast heartbeat  . A bad rash all over body  . Dizziness and weakness   Immunizations Administered    Name Date Dose VIS Date Route   Pfizer COVID-19 Vaccine 05/03/2019  1:56 PM 0.3 mL 03/03/2018 Intramuscular   Manufacturer: [employer], [employer]   Lot: [id number]   NDC: [address]-6

## 2019-05-24 ENCOUNTER — Ambulatory Visit: Payer: Medicaid Other | Attending: Internal Medicine

## 2019-05-24 DIAGNOSIS — Z23 Encounter for immunization: Secondary | ICD-10-CM

## 2019-05-24 NOTE — Progress Notes (Signed)
   Covid-19 Vaccination Clinic  Name:  Angel Mendez    MRN: [id number] DOB: [birth year]  05/24/2019  [name] was observed post Covid-19 immunization for 15 minutes without incident. She was provided with Vaccine Information Sheet and instruction to access the V-Safe system.   [name] was instructed to call 911 with any severe reactions post vaccine: Marland Kitchen Difficulty breathing  . Swelling of face and throat  . A fast heartbeat  . A bad rash all over body  . Dizziness and weakness   Immunizations Administered    Name Date Dose VIS Date Route   Pfizer COVID-19 Vaccine 05/24/2019  1:03 PM 0.3 mL 03/03/2018 Intramuscular   Manufacturer: [employer], [employer]   Lot: [id number]   NDC: [address]-4

## 2019-06-02 ENCOUNTER — Encounter: Payer: Self-pay | Admitting: Internal Medicine

## 2019-06-02 ENCOUNTER — Other Ambulatory Visit: Payer: Self-pay

## 2019-06-02 ENCOUNTER — Ambulatory Visit: Payer: Medicaid Other | Admitting: Internal Medicine

## 2019-06-02 VITALS — BP 126/78 | HR 67 | Temp 98.0°F | Ht 67.0 in | Wt 224.4 lb

## 2019-06-02 DIAGNOSIS — R030 Elevated blood-pressure reading, without diagnosis of hypertension: Secondary | ICD-10-CM | POA: Insufficient documentation

## 2019-06-02 DIAGNOSIS — M25511 Pain in right shoulder: Secondary | ICD-10-CM

## 2019-06-02 DIAGNOSIS — F1910 Other psychoactive substance abuse, uncomplicated: Secondary | ICD-10-CM

## 2019-06-02 DIAGNOSIS — F329 Major depressive disorder, single episode, unspecified: Secondary | ICD-10-CM | POA: Diagnosis not present

## 2019-06-02 DIAGNOSIS — F17210 Nicotine dependence, cigarettes, uncomplicated: Secondary | ICD-10-CM

## 2019-06-02 DIAGNOSIS — E119 Type 2 diabetes mellitus without complications: Secondary | ICD-10-CM | POA: Diagnosis present

## 2019-06-02 DIAGNOSIS — Z7984 Long term (current) use of oral hypoglycemic drugs: Secondary | ICD-10-CM

## 2019-06-02 DIAGNOSIS — G8929 Other chronic pain: Secondary | ICD-10-CM | POA: Insufficient documentation

## 2019-06-02 DIAGNOSIS — Z720 Tobacco use: Secondary | ICD-10-CM | POA: Insufficient documentation

## 2019-06-02 DIAGNOSIS — M25512 Pain in left shoulder: Secondary | ICD-10-CM | POA: Diagnosis not present

## 2019-06-02 LAB — POCT GLYCOSYLATED HEMOGLOBIN (HGB A1C): Hemoglobin A1C: 6.7 % — AB (ref 4.0–5.6)

## 2019-06-02 LAB — GLUCOSE, CAPILLARY: Glucose-Capillary: 142 mg/dL — ABNORMAL HIGH (ref 70–99)

## 2019-06-02 MED ORDER — BUPROPION HCL ER (SR) 150 MG PO TB12: 150.0000 mg | ORAL_TABLET | Freq: Two times a day (BID) | ORAL | Status: DC

## 2019-06-02 MED ORDER — METFORMIN HCL 500 MG PO TABS: 1000.0000 mg | ORAL_TABLET | Freq: Two times a day (BID) | ORAL | 2 refills | Status: DC

## 2019-06-02 MED ORDER — MELOXICAM 7.5 MG PO TABS: 7.5000 mg | ORAL_TABLET | Freq: Every day | ORAL | 0 refills | Status: DC

## 2019-06-02 MED ORDER — SERTRALINE HCL 50 MG PO TABS: 50.0000 mg | ORAL_TABLET | Freq: Every day | ORAL | 2 refills | Status: DC

## 2019-06-02 MED ORDER — TRAZODONE HCL 100 MG PO TABS: 100.0000 mg | ORAL_TABLET | Freq: Every day | ORAL | Status: DC

## 2019-06-02 NOTE — Patient Instructions (Addendum)
Thank you for allowing [place] to provide your care today. Today we discussed your diabetes and shoulder pain    I have ordered bmp, cbc, lipid panel and hemoglobin a1c labs for you. I will call if any are abnormal.    Today we made the following changes to your medications.    Please increase your metformin to 1000mg  BID by increasing by 500mg  every week Please take your other medications as prescribed  Please follow-up in 3 months.    Should you have any questions or concerns please call the internal medicine clinic at [phone].     Shoulder Pain Many things can cause shoulder pain, including:  An injury.  Moving the shoulder in the same way again and again (overuse).  Joint pain (arthritis). Pain can come from:  Swelling and irritation (inflammation) of any part of the shoulder.  An injury to the shoulder joint.  An injury to: ? Tissues that connect muscle to bone (tendons). ? Tissues that connect bones to each other (ligaments). ? Bones. Follow these instructions at home: Watch for changes in your symptoms. Let your doctor know about them. Follow these instructions to help with your pain. If you have a sling:  Wear the sling as told by your doctor. Remove it only as told by your doctor.  Loosen the sling if your fingers: ? Tingle. ? Become numb. ? Turn cold and blue.  Keep the sling clean.  If the sling is not waterproof: ? Do not let it get wet. ? Take the sling off when you shower or bathe. Managing pain, stiffness, and swelling   If told, put ice on the painful area: ? Put ice in a plastic bag. ? Place a towel between your skin and the bag. ? Leave the ice on for 20 minutes, 2-3 times a day. Stop putting ice on if it does not help with the pain.  Squeeze a soft ball or a foam pad as much as possible. This prevents swelling in the shoulder. It also helps to strengthen the arm. General instructions  Take over-the-counter and prescription medicines only  as told by your doctor.  Keep all follow-up visits as told by your doctor. This is important. Contact a doctor if:  Your pain gets worse.  Medicine does not help your pain.  You have new pain in your arm, hand, or fingers. Get help right away if:  Your arm, hand, or fingers: ? Tingle. ? Are numb. ? Are swollen. ? Are painful. ? Turn white or blue. Summary  Shoulder pain can be caused by many things. These include injury, moving the shoulder in the same away again and again, and joint pain.  Watch for changes in your symptoms. Let your doctor know about them.  This condition may be treated with a sling, ice, and pain medicine.  Contact your doctor if the pain gets worse or you have new pain. Get help right away if your arm, hand, or fingers tingle or get numb, swollen, or painful.  Keep all follow-up visits as told by your doctor. This is important. This information is not intended to replace advice given to you by your health care provider. Make sure you discuss any questions you have with your health care provider. Document Revised: 07/08/2017 Document Reviewed: 07/08/2017 Elsevier Patient Education  2020 09/08/2017.

## 2019-06-02 NOTE — Assessment & Plan Note (Signed)
Currently smokes about 1/3 pack daily. Mentions this is reduction from prior. >20 pack year smoking hx. Has multiple patches at home but has not been successful in quitting. Currently on bupropion on depression but unclear if this has helped. Discussed trying alternative methods to assist in smoking cessation in the future. She states she will consider this.  - C/w tobacco cessation counseling

## 2019-06-02 NOTE — Progress Notes (Signed)
CC: Diabetes  HPI: [name] is a 54 y.o. with PMH listed below presenting with complaint of diabetes. Please see problem based assessment and plan for further details.  Family History Unable to provide significant details Family History  Problem Relation Age of Onset  . Cancer - Other Mother   . Seizures Father   . Cancer - Lung Other    Social History Currently on disability. Lives with family. Drinks beer on weekends only. Smokes about 1/3 pack daily. Prior history of cocaine abuse but has not used since 2 years prior  Past Medical History:  Diagnosis Date  . Depression    currently on meds  . Diabetes mellitus    NIDDM  . GERD (gastroesophageal reflux disease)    treats w/OTC meds  . Meniscus tear 04/2011   right knee  . Pneumonia   . Seasonal allergies   . Wears glasses     Review of Systems: ROS   Physical Exam: Vitals:   06/02/19 1336 06/02/19 1842  BP: (!) 154/81 126/78  Pulse: 67   Temp: 98 F (36.7 C)   TempSrc: Oral   SpO2: 100%   Weight: 224 lb 6.4 oz (101.8 kg)   Height: 5\' 7"  (1.702 m)     Physical Exam  Constitutional: She is oriented to person, place, and time. She appears well-developed and well-nourished. No distress.  HENT:  Mouth/Throat: Oropharynx is clear and moist.  Eyes: Conjunctivae are normal.  Cardiovascular: Normal rate, regular rhythm, normal heart sounds and intact distal pulses.  No murmur heard. Respiratory: Effort normal and breath sounds normal. She has no wheezes. She has no rales.  GI: Soft. Bowel sounds are normal. She exhibits no distension. There is no abdominal tenderness.  Musculoskeletal:        General: Tenderness (Anterior tenderness bilateral shoulders without obvious edema or warmth. Active/passive ROM intact including adduction/external rotation. - Neer's test. ) present. Normal range of motion.     Cervical back: Normal range of motion and neck supple.  Neurological: She is alert and  oriented to person, place, and time.  Skin: Skin is warm and dry.      Assessment & Plan:   Diabetes mellitus, type II (Angel Mendez) [name] is a 54 yo F w/ PMH of T2DM, MDD, HTN, and hx of cocaine use presenting to [facility] to establish care. She mentions that she is running out of her chronic medications and needs refills. She mentions being diagnosed with T2DM in the past and was prescribed metformin 500mg  BID but was not instructed to up-titrate her meds. Denies any polyuria, polydipsia, polyphagia. Denies any significant GI side effects.  A/P Presents for management of T2DM. Unclear why metformin dose was kept low at 500mg  BID. Will up-titrate and assess for current diabetes control - Hgb a1c - bmp, lipid panel - Microalbumin/Creatinine urine - Metformin refill sent w/ instruction to up-titrate to 1000mg  BID  Depression Mentions prior hx of depression. Current PHQ-2 negative. Mentions good response to treatment with sertraline, bupropion and trazodone. Requests refills for meds.  - Able to confirm prior dispense with pill-boxes that Angel Mendez brought to the visit. Refill for depression meds sent  Chronic pain of both shoulders Presents w/ complaint of chronic bilateral shoulder pain. Spends lot of time braiding hair. Denies trauma. No fevers or chills. No weakness. On physical exam, tenderness to palpation but no appreciable effusion or laxity. Range of motion intact. Likely due to tendonitis without rupture.  - NSAIDs prn - Advised  on rest, icing - Instructions on stretching exercises provided  Substance abuse Mentions prior hx of cocaine use. Last use 2 years prior. Denies any current cravings. States that she was never diagnosed with complications such as cardiac disease or aortic dissection.  - C/w monitor  Tobacco use Currently smokes about 1/3 pack daily. Mentions this is reduction from prior. >20 pack year smoking hx. Has multiple patches at home but has not been  successful in quitting. Currently on bupropion on depression but unclear if this has helped. Discussed trying alternative methods to assist in smoking cessation in the future. She states she will consider this.  - C/w tobacco cessation counseling    Patient discussed with Dr. Sandre Kitty   -Judeth Cornfield, PGY2 [facility] Pager: [phone]

## 2019-06-02 NOTE — Assessment & Plan Note (Signed)
Mentions prior hx of cocaine use. Last use 2 years prior. Denies any current cravings. States that she was never diagnosed with complications such as cardiac disease or aortic dissection.  - C/w monitor

## 2019-06-02 NOTE — Assessment & Plan Note (Addendum)
Angel Mendez is a 54 yo F w/ PMH of T2DM, MDD, HTN, and hx of cocaine use presenting to [facility] to establish care. She mentions that she is running out of her chronic medications and needs refills. She mentions being diagnosed with T2DM in the past and was prescribed metformin 500mg  BID but was not instructed to up-titrate her meds. Denies any polyuria, polydipsia, polyphagia. Denies any significant GI side effects.  A/P Presents for management of T2DM. Unclear why metformin dose was kept low at 500mg  BID. Will up-titrate and assess for current diabetes control - Hgb a1c - bmp, lipid panel - Microalbumin/Creatinine urine - Metformin refill sent w/ instruction to up-titrate to 1000mg  BID

## 2019-06-02 NOTE — Assessment & Plan Note (Signed)
Presents w/ complaint of chronic bilateral shoulder pain. Spends lot of time braiding hair. Denies trauma. No fevers or chills. No weakness. On physical exam, tenderness to palpation but no appreciable effusion or laxity. Range of motion intact. Likely due to tendonitis without rupture.  - NSAIDs prn - Advised on rest, icing - Instructions on stretching exercises provided

## 2019-06-02 NOTE — Assessment & Plan Note (Signed)
Mentions prior hx of depression. Current PHQ-2 negative. Mentions good response to treatment with sertraline, bupropion and trazodone. Requests refills for meds.  - Able to confirm prior dispense with pill-boxes that [name] brought to the visit. Refill for depression meds sent

## 2019-06-03 LAB — LIPID PANEL
Chol/HDL Ratio: 3.5 ratio (ref 0.0–4.4)
Cholesterol, Total: 138 mg/dL (ref 100–199)
HDL: 40 mg/dL (ref >39)
LDL Chol Calc (NIH): 83 mg/dL (ref 0–99)
Triglycerides: 77 mg/dL (ref 0–149)
VLDL Cholesterol Cal: 15 mg/dL (ref 5–40)

## 2019-06-03 LAB — BMP8+ANION GAP
Anion Gap: 14 mmol/L (ref 10.0–18.0)
BUN/Creatinine Ratio: 10 (ref 9–23)
BUN: 7 mg/dL (ref 6–24)
CO2: 20 mmol/L (ref 20–29)
Calcium: 9.1 mg/dL (ref 8.7–10.2)
Chloride: 102 mmol/L (ref 96–106)
Creatinine, Ser: 0.72 mg/dL (ref 0.57–1.00)
GFR calc Af Amer: 112 mL/min/{1.73_m2} (ref >59)
GFR calc non Af Amer: 97 mL/min/{1.73_m2} (ref >59)
Glucose: 131 mg/dL — ABNORMAL HIGH (ref 65–99)
Potassium: 4.4 mmol/L (ref 3.5–5.2)
Sodium: 136 mmol/L (ref 134–144)

## 2019-06-03 LAB — CBC
Hematocrit: 39.6 % (ref 34.0–46.6)
Hemoglobin: 13.1 g/dL (ref 11.1–15.9)
MCH: 29.5 pg (ref 26.6–33.0)
MCHC: 33.1 g/dL (ref 31.5–35.7)
MCV: 89 fL (ref 79–97)
Platelets: 232 10*3/uL (ref 150–450)
RBC: 4.44 x10E6/uL (ref 3.77–5.28)
RDW: 14.8 % (ref 11.7–15.4)
WBC: 7.4 10*3/uL (ref 3.4–10.8)

## 2019-06-03 LAB — MICROALBUMIN / CREATININE URINE RATIO
Creatinine, Urine: 194.9 mg/dL
Microalb/Creat Ratio: 2 mg/g creat (ref 0–29)
Microalbumin, Urine: 3.4 ug/mL

## 2019-06-07 NOTE — Progress Notes (Signed)
Internal Medicine Clinic Attending  Case discussed with Dr. Lee at the time of the visit.  We reviewed the resident's history and exam and pertinent patient test results.  I agree with the assessment, diagnosis, and plan of care documented in the resident's note.  Blas Riches, M.D., Ph.D.  

## 2019-07-15 ENCOUNTER — Other Ambulatory Visit: Payer: Self-pay | Admitting: Internal Medicine

## 2019-07-15 DIAGNOSIS — M25512 Pain in left shoulder: Secondary | ICD-10-CM

## 2019-07-15 DIAGNOSIS — G8929 Other chronic pain: Secondary | ICD-10-CM

## 2019-07-15 DIAGNOSIS — E119 Type 2 diabetes mellitus without complications: Secondary | ICD-10-CM

## 2019-07-15 DIAGNOSIS — F329 Major depressive disorder, single episode, unspecified: Secondary | ICD-10-CM

## 2019-07-15 MED ORDER — MELOXICAM 7.5 MG PO TABS: 7.5000 mg | ORAL_TABLET | Freq: Every day | ORAL | 0 refills | Status: DC

## 2019-07-15 MED ORDER — BUPROPION HCL ER (SR) 150 MG PO TB12: 150.0000 mg | ORAL_TABLET | Freq: Two times a day (BID) | ORAL | 0 refills | Status: AC

## 2019-07-15 MED ORDER — TRAZODONE HCL 100 MG PO TABS: 100.0000 mg | ORAL_TABLET | Freq: Every day | ORAL | 1 refills | Status: AC

## 2019-07-15 MED ORDER — OMEPRAZOLE 20 MG PO CPDR: 20.0000 mg | DELAYED_RELEASE_CAPSULE | Freq: Every day | ORAL | 0 refills | Status: AC

## 2019-07-15 MED ORDER — SERTRALINE HCL 50 MG PO TABS: 50.0000 mg | ORAL_TABLET | Freq: Every day | ORAL | 0 refills | Status: AC

## 2019-07-15 MED ORDER — ACETAMINOPHEN ER 650 MG PO TBCR: 650.0000 mg | EXTENDED_RELEASE_TABLET | Freq: Three times a day (TID) | ORAL | 0 refills | Status: AC | PRN

## 2019-07-15 MED ORDER — METFORMIN HCL 500 MG PO TABS: 1000.0000 mg | ORAL_TABLET | Freq: Two times a day (BID) | ORAL | 0 refills | Status: DC

## 2019-07-15 NOTE — Telephone Encounter (Signed)
Refill Request  Pt is requesting all of the medications be transferred  acetaminophen (TYLENOL 8 HOUR) 650 MG CR tablet albuterol (PROVENTIL HFA;VENTOLIN HFA) 108 (90 Base) MCG/ACT inhaler buPROPion (WELLBUTRIN SR) 150 MG 12 hr tablet meloxicam (MOBIC) 7.5 MG tablet metFORMIN (GLUCOPHAGE) 500 MG tablet omeprazole (PRILOSEC) 20 MG capsule sertraline (ZOLOFT) 50 MG tablet traZODone (DESYREL) 100 MG tablet    [facility] [address] [place], [place] [address] Phone: [phone]

## 2019-09-02 ENCOUNTER — Encounter: Payer: Medicaid Other | Admitting: Internal Medicine

## 2019-10-24 ENCOUNTER — Other Ambulatory Visit: Payer: Self-pay | Admitting: Internal Medicine

## 2019-10-24 DIAGNOSIS — G8929 Other chronic pain: Secondary | ICD-10-CM

## 2019-11-01 ENCOUNTER — Other Ambulatory Visit: Payer: Self-pay | Admitting: Internal Medicine

## 2019-11-01 DIAGNOSIS — E119 Type 2 diabetes mellitus without complications: Secondary | ICD-10-CM
